# Patient Record
Sex: Male | Born: 1945
Health system: Southern US, Community
[De-identification: ages and names within clinical notes are randomized; demographics above are authoritative.]

## PROBLEM LIST (undated history)

## (undated) DIAGNOSIS — Z794 Long term (current) use of insulin: Secondary | ICD-10-CM

## (undated) DIAGNOSIS — N401 Enlarged prostate with lower urinary tract symptoms: Secondary | ICD-10-CM

## (undated) DIAGNOSIS — G250 Essential tremor: Secondary | ICD-10-CM

## (undated) DIAGNOSIS — T7840XA Allergy, unspecified, initial encounter: Secondary | ICD-10-CM

## (undated) DIAGNOSIS — Z87438 Personal history of other diseases of male genital organs: Secondary | ICD-10-CM

## (undated) DIAGNOSIS — N419 Inflammatory disease of prostate, unspecified: Secondary | ICD-10-CM

## (undated) DIAGNOSIS — M1990 Unspecified osteoarthritis, unspecified site: Secondary | ICD-10-CM

## (undated) DIAGNOSIS — E785 Hyperlipidemia, unspecified: Secondary | ICD-10-CM

## (undated) DIAGNOSIS — E669 Obesity, unspecified: Secondary | ICD-10-CM

## (undated) DIAGNOSIS — E079 Disorder of thyroid, unspecified: Secondary | ICD-10-CM

## (undated) DIAGNOSIS — N400 Enlarged prostate without lower urinary tract symptoms: Secondary | ICD-10-CM

## (undated) DIAGNOSIS — I10 Essential (primary) hypertension: Secondary | ICD-10-CM

## (undated) DIAGNOSIS — Z860100 Personal history of colon polyps, unspecified: Secondary | ICD-10-CM

## (undated) DIAGNOSIS — J189 Pneumonia, unspecified organism: Secondary | ICD-10-CM

## (undated) DIAGNOSIS — N529 Male erectile dysfunction, unspecified: Secondary | ICD-10-CM

## (undated) DIAGNOSIS — E039 Hypothyroidism, unspecified: Secondary | ICD-10-CM

## (undated) DIAGNOSIS — Z974 Presence of external hearing-aid: Secondary | ICD-10-CM

## (undated) DIAGNOSIS — N471 Phimosis: Secondary | ICD-10-CM

## (undated) DIAGNOSIS — Z87898 Personal history of other specified conditions: Secondary | ICD-10-CM

## (undated) DIAGNOSIS — N183 Chronic kidney disease, stage 3 unspecified: Secondary | ICD-10-CM

## (undated) DIAGNOSIS — E119 Type 2 diabetes mellitus without complications: Secondary | ICD-10-CM

## (undated) HISTORY — PX: TONSILLECTOMY: SUR1361

## (undated) HISTORY — PX: COLONOSCOPY: SHX174

## (undated) HISTORY — DX: Male erectile dysfunction, unspecified: N52.9

## (undated) HISTORY — DX: Allergy, unspecified, initial encounter: T78.40XA

## (undated) HISTORY — DX: Personal history of colonic polyps: Z86.010

## (undated) HISTORY — DX: Essential (primary) hypertension: I10

## (undated) HISTORY — DX: Benign prostatic hyperplasia without lower urinary tract symptoms: N40.0

## (undated) HISTORY — DX: Hyperlipidemia, unspecified: E78.5

## (undated) HISTORY — DX: Unspecified osteoarthritis, unspecified site: M19.90

## (undated) HISTORY — DX: Personal history of colon polyps, unspecified: Z86.0100

## (undated) HISTORY — DX: Obesity, unspecified: E66.9

## (undated) HISTORY — PX: CATARACT EXTRACTION W/ INTRAOCULAR LENS IMPLANT: SHX1309

---

## 1994-03-23 HISTORY — PX: HERNIA REPAIR: SHX51

## 2004-03-23 HISTORY — PX: COLONOSCOPY: SHX174

## 2004-03-23 LAB — HM COLONOSCOPY

## 2005-03-31 ENCOUNTER — Encounter: Admission: RE | Admit: 2005-03-31 | Discharge: 2005-03-31 | Payer: Self-pay | Admitting: Family Medicine

## 2005-10-22 ENCOUNTER — Ambulatory Visit: Payer: Self-pay | Admitting: Family Medicine

## 2005-10-23 ENCOUNTER — Ambulatory Visit: Payer: Self-pay | Admitting: Family Medicine

## 2005-11-12 ENCOUNTER — Ambulatory Visit: Payer: Self-pay | Admitting: Family Medicine

## 2006-01-18 ENCOUNTER — Ambulatory Visit: Payer: Self-pay | Admitting: Family Medicine

## 2006-01-21 HISTORY — PX: REVISION TOTAL HIP ARTHROPLASTY: SHX766

## 2006-01-22 ENCOUNTER — Encounter: Admission: RE | Admit: 2006-01-22 | Discharge: 2006-01-22 | Payer: Self-pay | Admitting: Family Medicine

## 2006-01-26 ENCOUNTER — Ambulatory Visit: Payer: Self-pay | Admitting: Family Medicine

## 2006-05-27 ENCOUNTER — Ambulatory Visit: Payer: Self-pay | Admitting: Family Medicine

## 2006-07-19 ENCOUNTER — Ambulatory Visit: Payer: Self-pay | Admitting: Family Medicine

## 2006-07-22 ENCOUNTER — Ambulatory Visit: Payer: Self-pay | Admitting: Family Medicine

## 2006-09-20 ENCOUNTER — Ambulatory Visit: Payer: Self-pay | Admitting: Family Medicine

## 2006-09-20 ENCOUNTER — Encounter: Admission: RE | Admit: 2006-09-20 | Discharge: 2006-09-20 | Payer: Self-pay | Admitting: Family Medicine

## 2006-09-27 ENCOUNTER — Ambulatory Visit: Payer: Self-pay | Admitting: Family Medicine

## 2006-12-08 ENCOUNTER — Ambulatory Visit: Payer: Self-pay | Admitting: Family Medicine

## 2007-01-13 ENCOUNTER — Ambulatory Visit: Payer: Self-pay | Admitting: Family Medicine

## 2007-01-19 ENCOUNTER — Ambulatory Visit: Payer: Self-pay | Admitting: Family Medicine

## 2007-02-08 ENCOUNTER — Inpatient Hospital Stay (HOSPITAL_COMMUNITY): Admission: RE | Admit: 2007-02-08 | Discharge: 2007-02-11 | Payer: Self-pay | Admitting: Orthopedic Surgery

## 2007-02-08 HISTORY — PX: TOTAL HIP ARTHROPLASTY: SHX124

## 2007-03-24 HISTORY — PX: BACK SURGERY: SHX140

## 2007-06-01 ENCOUNTER — Ambulatory Visit: Payer: Self-pay | Admitting: Family Medicine

## 2007-06-21 ENCOUNTER — Encounter: Admission: RE | Admit: 2007-06-21 | Discharge: 2007-06-21 | Payer: Self-pay | Admitting: Orthopedic Surgery

## 2007-07-07 ENCOUNTER — Encounter: Admission: RE | Admit: 2007-07-07 | Discharge: 2007-07-07 | Payer: Self-pay | Admitting: Orthopedic Surgery

## 2007-07-20 ENCOUNTER — Ambulatory Visit: Payer: Self-pay | Admitting: Family Medicine

## 2007-07-20 IMAGING — CR DG LUMBAR SPINE 2-3V
3 series · 3 of 3 positions shown · non-contrast
Comparison: CT myelogram [DATE].

CLINICAL DATA: Preop spinal stenosis.

LUMBAR SPINE - 2-3 VIEW

[t l-spine a.p. *]
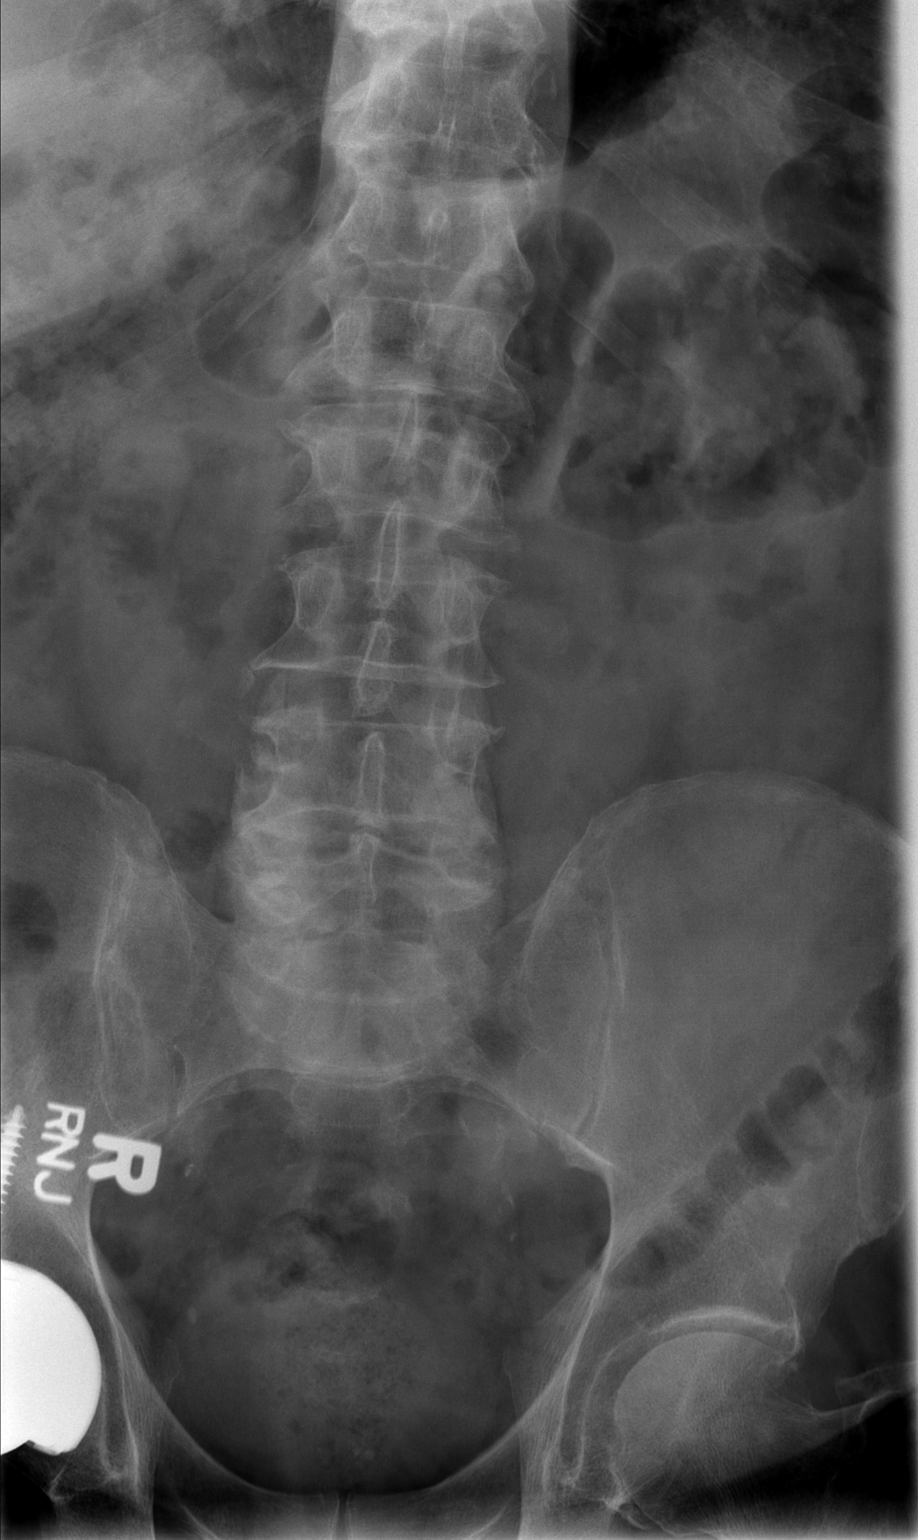

[t l-spine lat]
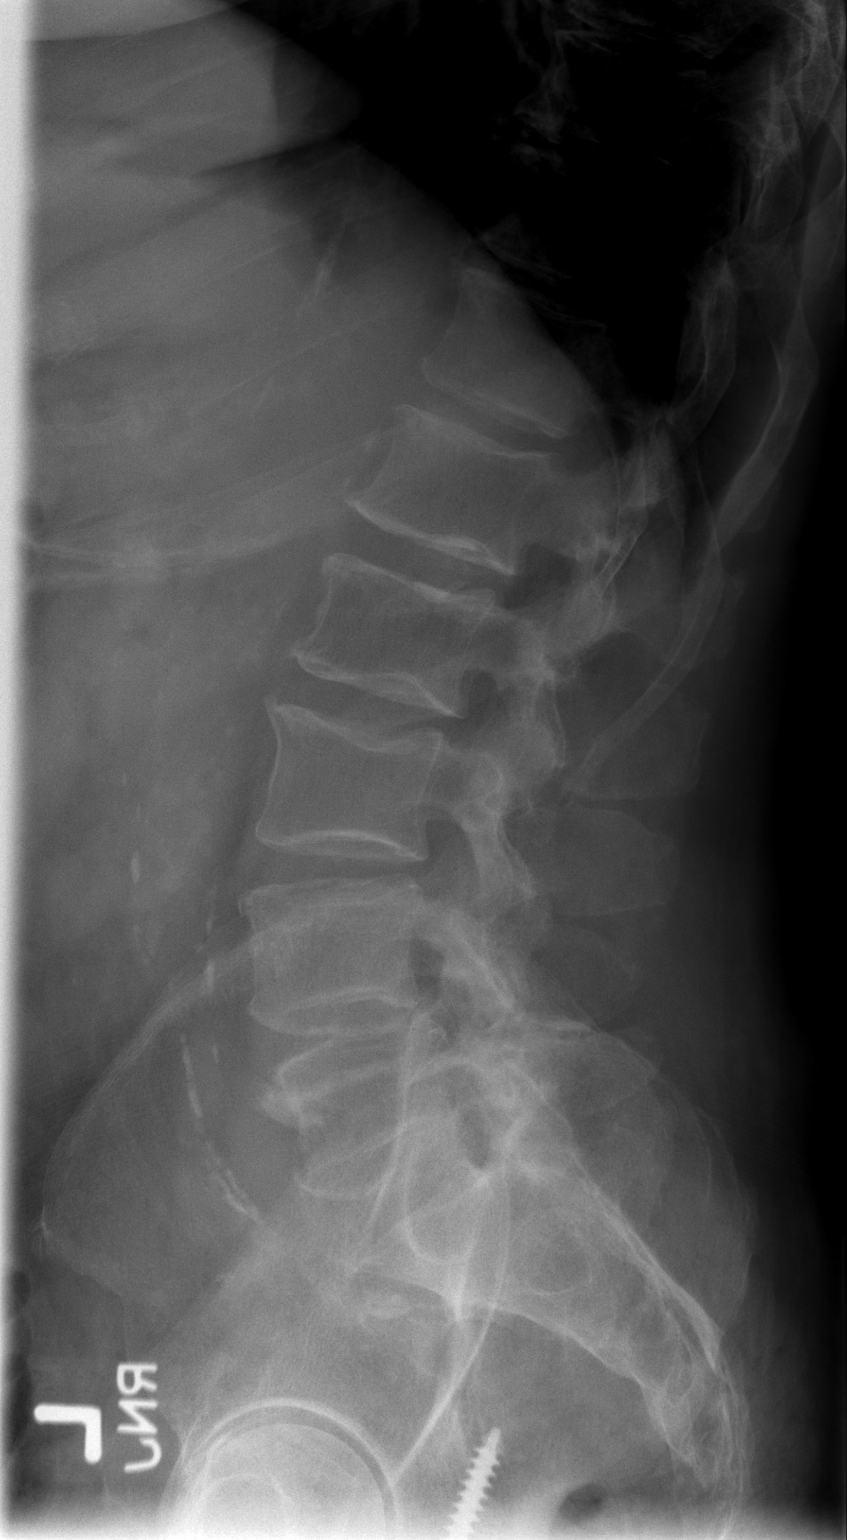

[t l-spine l5-s1 spot]
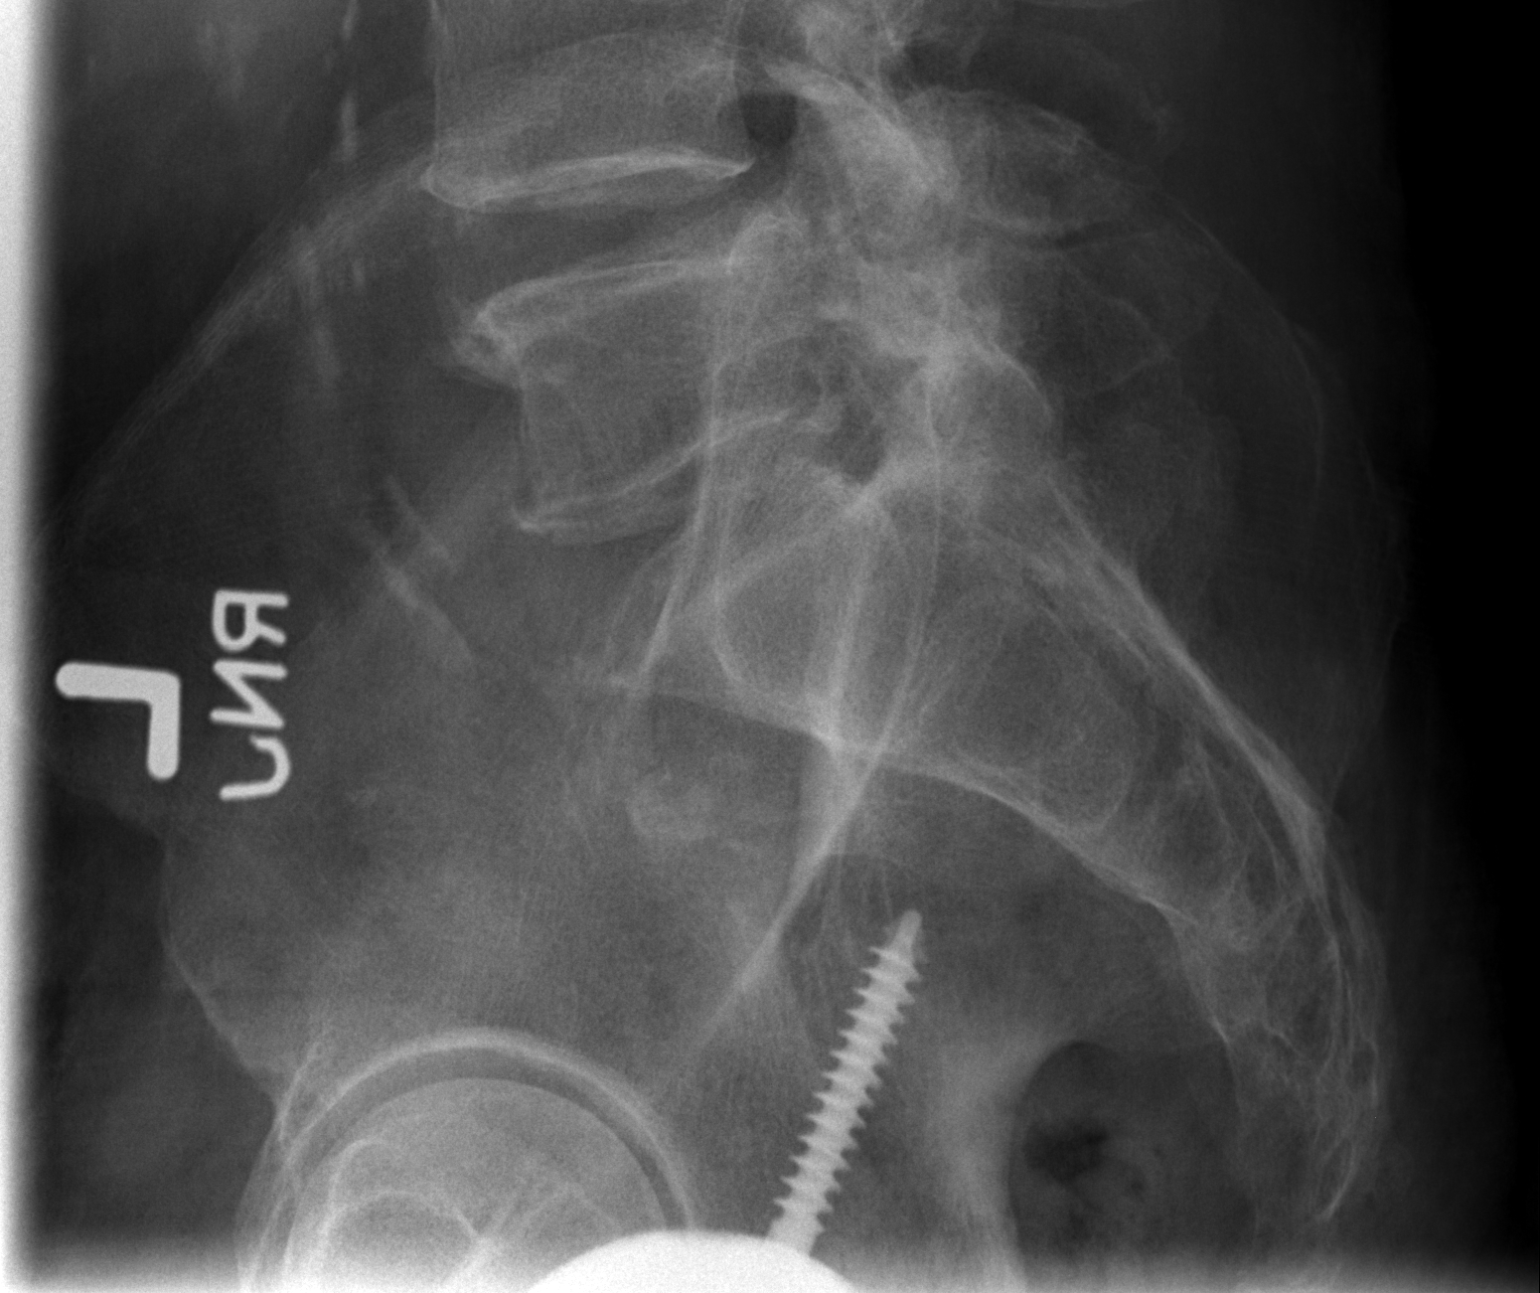

[3 of 3 positions shown; findings below may reference images not displayed]

FINDINGS: There are five lumbar type vertebral vertebrae.
Multilevel endplate degenerative changes are seen, worse L4-5 and
L5-S1.  There is facet hypertrophy as well.  There may be minimal
grade 1 anterolisthesis of L4 on L5.  Atherosclerotic calcification
of the arterial vasculature.
IMPRESSION: 1. Spondylosis, worst at L4-5 and L5-S1.
2.  Suspect minimal grade 1 anterolisthesis L4-5.

## 2007-07-27 ENCOUNTER — Inpatient Hospital Stay (HOSPITAL_COMMUNITY): Admission: RE | Admit: 2007-07-27 | Discharge: 2007-07-29 | Payer: Self-pay | Admitting: Orthopedic Surgery

## 2007-07-27 HISTORY — PX: LUMBAR LAMINECTOMY: SHX95

## 2007-11-01 ENCOUNTER — Ambulatory Visit: Payer: Self-pay | Admitting: Family Medicine

## 2007-12-15 ENCOUNTER — Ambulatory Visit: Payer: Self-pay | Admitting: Family Medicine

## 2008-03-21 ENCOUNTER — Ambulatory Visit: Payer: Self-pay | Admitting: Family Medicine

## 2008-07-11 ENCOUNTER — Ambulatory Visit: Payer: Self-pay | Admitting: Family Medicine

## 2008-08-22 ENCOUNTER — Ambulatory Visit: Payer: Self-pay | Admitting: Family Medicine

## 2008-11-29 ENCOUNTER — Ambulatory Visit: Payer: Self-pay | Admitting: Family Medicine

## 2008-12-27 ENCOUNTER — Ambulatory Visit: Payer: Self-pay | Admitting: Family Medicine

## 2008-12-27 ENCOUNTER — Encounter: Admission: RE | Admit: 2008-12-27 | Discharge: 2008-12-27 | Payer: Self-pay | Admitting: Family Medicine

## 2008-12-28 ENCOUNTER — Ambulatory Visit: Payer: Self-pay | Admitting: Family Medicine

## 2009-01-28 ENCOUNTER — Ambulatory Visit: Payer: Self-pay | Admitting: Family Medicine

## 2009-02-26 ENCOUNTER — Ambulatory Visit: Payer: Self-pay | Admitting: Family Medicine

## 2009-04-29 ENCOUNTER — Ambulatory Visit: Payer: Self-pay | Admitting: Family Medicine

## 2009-09-18 ENCOUNTER — Ambulatory Visit: Payer: Self-pay | Admitting: Family Medicine

## 2009-10-22 ENCOUNTER — Ambulatory Visit: Payer: Self-pay | Admitting: Family Medicine

## 2009-11-03 ENCOUNTER — Emergency Department (HOSPITAL_COMMUNITY): Admission: EM | Admit: 2009-11-03 | Discharge: 2009-11-04 | Payer: Self-pay | Admitting: Emergency Medicine

## 2009-11-07 ENCOUNTER — Ambulatory Visit: Payer: Self-pay | Admitting: Family Medicine

## 2009-11-19 ENCOUNTER — Ambulatory Visit: Payer: Self-pay | Admitting: Family Medicine

## 2010-02-19 ENCOUNTER — Encounter: Admission: RE | Admit: 2010-02-19 | Discharge: 2010-02-19 | Payer: Self-pay | Admitting: Family Medicine

## 2010-02-19 ENCOUNTER — Ambulatory Visit: Payer: Self-pay | Admitting: Family Medicine

## 2010-02-28 ENCOUNTER — Ambulatory Visit: Payer: Self-pay | Admitting: Family Medicine

## 2010-03-13 ENCOUNTER — Ambulatory Visit: Payer: Self-pay | Admitting: Family Medicine

## 2010-03-23 HISTORY — PX: CATARACT EXTRACTION W/ INTRAOCULAR LENS IMPLANT: SHX1309

## 2010-03-27 ENCOUNTER — Ambulatory Visit
Admission: RE | Admit: 2010-03-27 | Discharge: 2010-03-27 | Payer: Self-pay | Source: Home / Self Care | Attending: Family Medicine | Admitting: Family Medicine

## 2010-06-06 ENCOUNTER — Ambulatory Visit (INDEPENDENT_AMBULATORY_CARE_PROVIDER_SITE_OTHER): Payer: No Typology Code available for payment source | Admitting: Family Medicine

## 2010-06-06 ENCOUNTER — Ambulatory Visit: Payer: Self-pay | Admitting: Family Medicine

## 2010-06-06 DIAGNOSIS — IMO0001 Reserved for inherently not codable concepts without codable children: Secondary | ICD-10-CM

## 2010-06-06 LAB — URINALYSIS, ROUTINE W REFLEX MICROSCOPIC
Glucose, UA: 250 mg/dL — AB
Ketones, ur: 15 mg/dL — AB
pH: 5.5 (ref 5.0–8.0)

## 2010-06-06 LAB — URINE MICROSCOPIC-ADD ON

## 2010-06-06 LAB — URINE CULTURE: Culture  Setup Time: 201108151321

## 2010-06-17 ENCOUNTER — Ambulatory Visit (INDEPENDENT_AMBULATORY_CARE_PROVIDER_SITE_OTHER): Payer: No Typology Code available for payment source | Admitting: Family Medicine

## 2010-06-17 DIAGNOSIS — I10 Essential (primary) hypertension: Secondary | ICD-10-CM

## 2010-06-17 DIAGNOSIS — E669 Obesity, unspecified: Secondary | ICD-10-CM

## 2010-06-17 DIAGNOSIS — E785 Hyperlipidemia, unspecified: Secondary | ICD-10-CM

## 2010-06-17 DIAGNOSIS — E119 Type 2 diabetes mellitus without complications: Secondary | ICD-10-CM

## 2010-07-22 ENCOUNTER — Ambulatory Visit (INDEPENDENT_AMBULATORY_CARE_PROVIDER_SITE_OTHER): Payer: No Typology Code available for payment source | Admitting: Medical

## 2010-07-22 DIAGNOSIS — H6120 Impacted cerumen, unspecified ear: Secondary | ICD-10-CM

## 2010-07-31 ENCOUNTER — Other Ambulatory Visit: Payer: Self-pay | Admitting: Family Medicine

## 2010-08-02 ENCOUNTER — Other Ambulatory Visit: Payer: Self-pay | Admitting: Family Medicine

## 2010-08-04 ENCOUNTER — Other Ambulatory Visit: Payer: Self-pay | Admitting: Family Medicine

## 2010-08-05 NOTE — Op Note (Signed)
NAMEBARNELL, [name] NO.:  [id number]   MEDICAL RECORD NO.:  [id number]          PATIENT TYPE:  INP   LOCATION:  0005                         FACILITY:  [facility]   PHYSICIAN:  Madlyn Frankel. Charlann Boxer, M.D.  DATE OF BIRTH:  October 10, 1945   DATE OF PROCEDURE:  02/08/2007  DATE OF DISCHARGE:                               OPERATIVE REPORT   PREOPERATIVE DIAGNOSIS:  Right hip osteoarthritis.   POSTOPERATIVE DIAGNOSIS:  Right hip osteoarthritis.   PROCEDURE:  Right total hip replacement.   COMPONENTS USED:  DuPuy hip system/54 Pinnacle cup, single cancellous  screw placed, 36 metal on metal liner, and 9 high Trilock stem, 36 1.5  ball.   SURGEON:  Madlyn Frankel. Charlann Boxer, M.D.   ASSISTANT:  Yetta Glassman. Mann, PA   ANESTHESIA:  General.   ESTIMATED BLOOD LOSS:  300 mL.   COMPLICATIONS:  None.   DRAINS:  X1   INDICATIONS FOR PROCEDURE:  The patient is a pleasant 65 year old  gentleman kindly referred over for evaluation of his right hip pain.  Radiographs revealed end-stage changes.  He failed conservative measures  and wished to proceed with surgical intervention of DJD.  Risks of  infection, DVT, component failure, dislocation, need for revision  surgery all discussed.  Consent was obtained with the benefit of pain  relief as an option.   DESCRIPTION OF PROCEDURE:  The patient was brought to the operating  room.  Once adequate anesthesia, preoperative antibiotics, 2 grams of  Ancef were administered, the patient was positioned in the dorsal  lateral decubitus position with the right side up.  The right lower  extremity was prescrubbed, prepped and draped in usual sterile fashion.  A lateral based incision was made for the posterior portion of the hip.  The iliotibial band and gluteus fascia were incised posteriorly.  The  short external rotators were taken down separated from the posterior  capsule.  An L capsulotomy was created preserving the posterior leaflet  for  protection of the sciatic nerve and then at the end of the case,  anatomic repair.  The hip was dislocated and pathology noted.  Neck  osteotomy was made.  Please note on preoperative radiograms, the patient  was noted to have a short femoral neck with a varus angle, but had also  through the pelvis it appeared.   I made a neck osteotomy into the trochanteric fossa.  At this point, I  began broaching the femur.  Femoral retractors were placed.  Debridement  was carried out as necessary.  I used a starting drill followed by the  canal finder and then irrigated the canal without emboli.  I then began  broaching with a size 2 broach starting my anteversion of the femur at  25-30 degrees.  I broached all the way up to initially a size 7 and then  used a calcar planar to finish this cutoff.   At this point I attended to the acetabulum where acetabular retractors  were placed.  A labrectomy was carried out and he was noted to have some  osteophytes anterior and posterior.  I  kept these in place initially.  Following retractor placement, I began reaming with the 45 reamer and  went to even numbers and sharper reamer.  We reamed all the way up to a  52 reamer and then a 53.  I used curved reamers to help with my  orientation to the cup.  Following the use of the 53 reamer, the bone  bed preparation appeared to be very good.  I did ream down to the medial  wall and then probably reamed a little proximal for bony coverage.   The final 54 Pinnacle cup was then impacted with an excellent scratch  fit.  The position of the cup was about 45 degrees adduction which with  his larger thorax probably needed to come down to 35-40 degree angle.  This was forward flexed about 20 degrees with the use of my guide.  At  this point a cancellous screw was placed and 36 liner placed.  At this  point trial reduction was carried out with 7 stem.  Initial reduction  indicated that the leg lengths were shorter and  there was significant  shuck available.  I took the 7 broach out and went to an 8 which sat  just basically at the same level and so I went to a 9 which sat a few mm  out of my neck cut.  I then trialed with a 9 high offset neck and 36 1.5  ball.  Hip stability was very good noting some anterior impingement with  internal rotation about 70 degrees with neutral abduction, stable in  sleep position, no evidence of impingement with external rotation and  extension.  When compared to the down leg, the leg lengths appeared to  be equal or within acceptable limits.   At this point, I chose the size 9 high offset stem, the trial components  were removed.  Central hole eliminator placed and a 36 metal liner  impacted.   At this point with the acetabulum exposed, I used a half inch curved  osteotome and removed posterior osteophytes through the inferior aspect  of the cup which was the calcified inferior acetabular ligament.  I also  removed osteophyte anteriorly from approximately 1 o'clock to 5 o'clock  on the face of a clock on his right hip.  At this point the final 9 high  offset stem was impacted to the level of my broach and I did retrial  just to make certain I was happy with position and orientation.  A final  36 1.5 ball was then impacted onto the clean and dried trunion.  Hip  anteversion was 45-50 degrees.  Hip was irrigated throughout the case  and again at this point.  I reapproximated the posterior capsule of the  superior leaflet using #1 Ethibond.  A medium Hemovac drain was placed  deep.  The iliotibial band was reapproximated with #1 Vicryl.  The  gluteal fascia was reapproximated using #1 Vicryl.  The remainder of the  wound was closed in layers.  The remainder of the wound was at this  point closed with 2-0 Vicryl and running 4-0 Monocryl.  The hip wound  was then cleaned, dried, and dressed sterilely with Steri-Strips and  Mepilex dressing.  He was brought to the recovery  room, extubated, in  stable condition.      Madlyn Frankel Charlann Boxer, M.D.  Electronically Signed     MDO/MEDQ  D:  02/08/2007  T:  02/08/2007  Job:  [id number]  cc:   [name], M.D.  Fax: [phone]

## 2010-08-05 NOTE — H&P (Signed)
[name], [name] NO.:  [id number]   MEDICAL RECORD NO.:  [id number]        PATIENT TYPE:  LINP   LOCATION:                               FACILITY:  [facility]   PHYSICIAN:  Madlyn Frankel. Charlann Boxer, M.D.  DATE OF BIRTH:  12-25-1945   DATE OF ADMISSION:  02/08/2007  DATE OF DISCHARGE:                              HISTORY & PHYSICAL   PROCEDURE:  Right total hip arthroplasty.   CHIEF COMPLAINTS:  Right hip and groin pain.   HISTORY OF PRESENT ILLNESS:  A 65 year old male with a history of right  hip pain secondary to osteoarthritis.  Refractory to all conservative  treatment including intra-articular injection.  He has been  presurgically assessed by Dr. [name].   PAST MEDICAL HISTORY:  Significant for  1. Osteoarthritis.  2. Hypertension.  3. Benign prostatic hypertrophy.  4. Diabetes.  5. Hypercholesteremia.   PAST SURGICAL HISTORY:  1. Tonsillectomy.  2. Hernia repair.   FAMILY HISTORY:  Heart disease, hypertension, diabetes, stroke,  arthritis.   SOCIAL HISTORY:  Married.  Primary caregiver will be wife after surgery.   DRUG ALLERGIES:  SULFA, and during previous anesthesia he broke out in a  sweat and had some nausea and dry heaves.   MEDICATIONS:  1. Avandamet two times daily 4 mg/1000 mg combo.  2. Simvastatin 40 mg p.o. daily.  3. Benicar 40 mg p.o. daily.  4. Doxazosin 2 mg p.o. daily.  5. Lisinopril 12.5 mg one p.o. daily.  6. Ibuprofen p.r.n.  7. Multivitamin p.o. daily.  8. Aspirin 325 mg p.o. daily.  9. Glucosamine chondroitin 1000 mg two times daily.  10.Cortef 4 mg p.o. daily.   REVIEW OF SYSTEMS:  GENITOURINARY:  Does have instances of blood in  urine with weak streaming urgency.  HEMATOLOGY:  Easily bruises.  Otherwise see HPI.   PHYSICAL EXAMINATION:  VITAL SIGNS:  Pulse 72, respirations 18, blood  pressure 134/66.  GENERAL:  Awake, alert and oriented, well-developed, well-nourished, no  acute distress.  NECK:  Supple.  No  carotid bruits.  CHEST/LUNGS:  Clear to auscultation bilaterally.  BREASTS:  Deferred.  HEART:  Regular rate and rhythm without gallops, clicks, rubs or  murmurs.  ABDOMEN:  Soft, nontender, nondistended.  Bowel sounds present.  GENITOURINARY:  Deferred.  EXTREMITIES:  Decreased range of motion, increased pain with range of  motion.  Does walk with significant limp.  SKIN:  No cellulitis.  NEUROLOGIC:  Intact distal sensibilities.   Labs, EKG, chest x-ray pending.   IMPRESSION:  1. Osteoarthritis.  2. Diabetes.  3. Hypertension.  4. Hyperlipidemia.  5. Benign prostatic hypertrophy.   PLAN OF ACTION:  Right total hip arthroplasty.  Risks and complications  were discussed.   Postoperative medications including Lovenox, Robaxin, iron, aspirin,  Colace, MiraLax provided at time of history and physical.  Postoperative  pain medicine will be provided at time of surgery.     ______________________________  Yetta Glassman [address], [place]      Madlyn Frankel. Charlann Boxer, M.D.  Electronically Signed    BLM/MEDQ  D:  01/27/2007  T:  01/28/2007  Job:  [id number]   cc:   [name], M.D.  Fax: [phone]

## 2010-08-05 NOTE — H&P (Signed)
NAME:  [name], [name]              ACCOUNT NO.:  [id number]   MEDICAL RECORD NO.:  [id number]          PATIENT TYPE:  INP   LOCATION:  NA                           FACILITY:  [facility]   PHYSICIAN:  Georges Lynch. [name], M.D.DATE OF BIRTH:  Jul 03, 1945   DATE OF ADMISSION:  07/27/2007  DATE OF DISCHARGE:                              HISTORY & PHYSICAL   CHIEF COMPLAINT:  Lower back and right leg pain.   HISTORY OF PRESENT ILLNESS:  The patient is a 65 year old gentleman with  some lower back and right leg issues.  Evaluation by Dr. [name] found  that he has significant spinal stenosis at L4-5 with about a 1 mm  slippage on flexion/extension myelogram.  He also has a small disk at L4-  5 on the right.  The patient has classic L5 right nerve root irritation.  The patient and Dr. [name] have elected to proceed with a central  decompression lumbar laminectomy and foraminotomy bilaterally at L4-5  with an evaluation of  the desk.   PAST MEDICAL HISTORY:  1. Hypertension.  2. Hypercholesterolemia.  3. Diabetes.  4. BPH.  5. Hearing impaired with bilateral hearing aids.  6. Upper and lower dentures.   ALLERGIES:  SULFA   CURRENT MEDICATIONS:  1. Benicar 40 mg a day.  2. Avandamet 06/998 mg twice a day.  3. Lisinopril/hydrochlorothiazide 20/12.5 mg once a day.  4. Doxazosin 2 mg once a day.  5. Methocarbamol 500 mg as needed.  6. Simvastatin 40 mg once a day.  7. Dilaudid 2 mg 1 tablet every 4-6 hours as needed.   PRIMARY CARE PHYSICIAN:  [name], M.D.   PAST SURGICAL HISTORY:  1. Tonsillectomy at 65 years of age.  2. Abdominal hernia in 1996.  3. Right total hip arthroplasty in 2008.  4. The patient did have some problems with conscious sedation with his      intra-articular hip injections in 2008.   FAMILY MEDICAL HISTORY:  Father is deceased from stroke at 53 years of  age.  Mother is deceased from diabetes, kidney failure at age 28.   SOCIAL HISTORY:  The patient is  married, lives with his wife.  He  stopped smoking about 13 years previous.  No use of alcohol or drugs.  He has one grown child.   PHYSICAL EXAMINATION:  VITALS:  Height is 5 feet 8, weight is 230, blood  pressure is 128/68, pulse of 74 and regular, respirations are 12 and  nonlabored, patient is afebrile.  GENERAL:  This is short-statured, centrally obese gentleman, conscious,  alert and appropriate, appears to be a good historian.  HEENT: Head was normocephalic.  Pupils equal, round and reactive.  Gross  hearing was intact with bilateral hearing aids in place.  NECK:  Supple.  No palpable lymphadenopathy.  Good range of motion.  CHEST:  Lung sounds were clear and equal bilaterally.  No wheezes,  rales, rhonchi.  HEART:  Regular rate and rhythm.  No murmurs, rubs or gallops.  ABDOMEN:  Round, soft nontender.  Bowel sounds present.  EXTREMITIES:  Upper extremities had good range  of motion.  Lower  Extremities: He had some soreness with his right hip which has a well-  healed lateral incision.  He had full extension.  He was able to flex it  up to 110 degrees.  He had about 15 degrees internal-external rotation.  He had full range of motion of his left hip.  He had good range of both  knees and ankles today.  NEURO:  The patient was conscious, alert and appropriate.  Good  historian.  He had no gross neurologic defects noticed at this time.  He  had good motor strength in the lower extremities without any foot drop.  He had good light touch sensation.  PERIPHERAL VASCULAR:  Carotid pulses were no bruits.  Radial pulses were  2+.  Posterior tibial pulses were 1+.  BREAST, RECTAL AND GU:  Exams were deferred.   IMPRESSION:  1. Severe spinal stenosis at L4-5 with a 1 mm slip with      flexion/extension with an L5 nerve root irritation.  2. Hypertension.  3. Hypercholesterolemia.  4. Benign prostatic hypertrophy.  5. Diabetes.  6. Hearing impaired with bilateral hearing aids.  7.  Upper and lower dentures.   PLAN:  The patient will undergo all routine labs and tests prior to  having a central decompression lumbar laminectomy at L4-5 with bilateral  foraminotomies with evaluation of the disk at L4-5.      [name], P.A.    ______________________________  Georges Lynch [name], M.D.    RWK/MEDQ  D:  07/21/2007  T:  07/21/2007  Job:  [id number]

## 2010-08-05 NOTE — Op Note (Signed)
NAME:  [name], [name] NO.:  [id number]   MEDICAL RECORD NO.:  [id number]          PATIENT TYPE:  INP   LOCATION:  0004                         FACILITY:  [facility]   PHYSICIAN:  Georges Lynch. [name], M.D.DATE OF BIRTH:  1945/05/07   DATE OF PROCEDURE:  07/27/2007  DATE OF DISCHARGE:                               OPERATIVE REPORT   SURGEON:  Dr. [name].   ASSISTANT:  Dr. Jene Every.   PREOPERATIVE DIAGNOSES:  1. Severe spinal stenosis at L4-5.  2. Moderate spinal stenosis at 3-4.  3. 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   POSTOPERATIVE DIAGNOSES:  1. Severe spinal stenosis at L4-5.  2. Moderate spinal stenosis at 3-4.  3. 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   Note, all his pain was on the right lower extremity.   OPERATION:  1. Central decompressive lumbar laminectomy at L4-5.  2. Decompressive lumbar laminectomy at L3-4.  3. Foraminotomies at both levels bilaterally.   DESCRIPTION OF PROCEDURE:  Under general anesthesia with the patient on  a spinal frame, a routine orthopedic prep and draping of the back was  carried out.  Great care was taken with his positioning because of his  total hip on the right.  Following that, two needles were placed in the  back for localization purposes and an x-ray was taken.  We verified the  L4-5, 3-4 space.  An incision was made.  Bleeders identified and  cauterized.  The muscle was stripped from the spinous processes of the  lamina of L3-4 and L4-5.  We then went down and inserted the self-  retaining retractors.  Another x-ray was taken.  Finally, a third x-ray  was taken to verified our 4-5 space.  We then removed the spinous  process of L4 and did a complete laminectomy.  We then went up  proximally, removed part of the spinous process of L3 and went up into  the L3-4 region as well and decompressed that by removing ligamentum  flavum.  So basically, we brought the microscope in and then protected  the dura and  dissected the adhesions off of the dura, mainly involving  the ligamentum flavum.  We protected the dura, went out laterally  bilaterally and did foraminotomies as well at 4-5 and 3-4.  He had some  adhesions down from what looked like a ruptured synovial cyst at 4-5.  We removed those as well.  When the dissection was completed, we were  able to easily pass a hockey-stick proximally and distally, there was no  resistance.  We went out into the foramina bilaterally and they were  wide open as well.  We thoroughly irrigated out the area.  There were no  herniated disks.  We then injected some FloSeal 10 mL.  I then loosely  applied some thrombin-soaked Gelfoam in and about the  laminar regions and just above the dura.  We then closed the wound in  layers in the usual fashion.  I left a small portion of the proximal and  distal deep parts of the wound open for drainage purposes.  The subcu  was closed with #1 Vicryl, the skin with metal staples and sterile  Neosporin dressings were applied.           ______________________________  Georges Lynch [name], M.D.     RAG/MEDQ  D:  07/27/2007  T:  07/27/2007  Job:  [id number]   cc:   [name], M.D.  Fax: [phone]

## 2010-08-06 ENCOUNTER — Telehealth: Payer: Self-pay | Admitting: Family Medicine

## 2010-08-07 NOTE — Telephone Encounter (Signed)
Faxed order

## 2010-08-08 NOTE — Discharge Summary (Signed)
NAMEWRIGHT, GRAVELY NO.:  [id number]   MEDICAL RECORD NO.:  [id number]          PATIENT TYPE:  INP   LOCATION:  1619                         FACILITY:  [facility]   PHYSICIAN:  Georges Lynch. [name], M.D.DATE OF BIRTH:  20-Oct-1945   DATE OF ADMISSION:  07/27/2007  DATE OF DISCHARGE:  07/29/2007                               DISCHARGE SUMMARY   ADMISSION DIAGNOSES:  1. Severe spinal stenosis L4-5 with a 1-mm slipped with      flexion/extension with a L5 nerve root irritation.  2. Hypertension.  3. Hypercholesterolemia.  4. Benign prostatic hypertrophy.  5. Diabetes.  6. Hearing impaired with bilateral hearing aids.  7. Upper and lower dentures.   DISCHARGE DIAGNOSES:  1. Decompressive lumbar laminectomy at L3-4, L4-5 with foraminotomies      bilaterally.  2. Hypertension.  3. Hypercholesterolemia.  4. Benign prostatic hypertrophy.  5. Diabetes.  6. Hearing impaired with bilateral hearing aids.  7. Upper and lower dentures.   HISTORY OF PRESENT ILLNESS:  The patient is a 65 year old gentleman with  lower back pain and right leg pain, evaluated and found by CT myelogram  to have significant spinal stenosis at L4-5 with a 1-mm slippage.  The  patient also had a small disk herniation at L4-5 on the right.  The  patient has elected proceed with surgical decompression by Dr. [name].   ALLERGIES:  SULFA.   CURRENT MEDICATIONS ON ADMISSION:  1. Benicar 40 mg a day.  2. Avandamet 06/998 mg twice a day.  3. Lisinopril/hydrochlorothiazide 20/12.5 mg a day.  4. Doxazosin 2 mg a day.  5. Robaxin 500 mg as needed.  6. Simvastatin 40 mg once a day.  7. Dilaudid 2 mg tablets 1 tablet every 4-6 hours for pain if needed.   SURGICAL PROCEDURE:  On Jul 27, 2007, the patient was taken to the OR by  Dr. Worthy Rancher, assisted by Dr. Jene Every, and under general  anesthesia the patient underwent a decompressive lumbar laminectomy at  L3-4 and L4-5 with bilateral  foraminotomies.  The patient tolerated the  procedure well with no complications.  The patient was transferred to  the recovery room and then to orthopedic floor in good condition.   CONSULTS:  The following routine consults were requested; physical  therapy and case management.   HOSPITAL COURSE:  On Jul 27, 2007, the patient was admitted to [facility] under the care of Dr. Worthy Rancher.  The patient was taken  to the OR, where a central decompressive lumbar laminectomy was  performed at L3-4 and L4-5 with bilateral foraminotomies.  The patient  tolerated the procedure well.  The patient was transferred to the  recovery room and then to the orthopedic floor in good condition with IV  antibiotics and pain medicines.  The patient then incurred 2 days  postoperative care on the orthopedic floor in which the patient was able  to wean off his IV narcotics and antibiotics without any problems.  The  patient's wound remained benign for any signs of infection.  The patient  was able to void.  His vital signs were stable throughout.  He was able  to ambulate with the use of Aoun with physical therapy.  The pain in  his lower extremity significantly improved.  He was neurologically  intact.  It was determined on postop day #2, that he was orthopedically  and medically stable and ready for discharge home with outpatient  physical therapy arrangements made.   LABORATORY DATA:  CBC on admission found WBCs 9.0, hemoglobin 11.9,  hematocrit 34.7, platelets 287.  Routine chemistries found sodium 138,  potassium 4.5, glucose 155, BUN 15, creatinine 1.09 with an estimated  GFR of greater than 60.  Urinalysis was normal on admission.   DISCHARGE INSTRUCTIONS:   DIET:  The patient is to maintain an 1800 calorie ADA diet.   ACTIVITY:  No restrictions.   WOUND CARE:  The patient is to change his dressing daily.   FOLLOW UP:  The patient is to follow-up with Dr. [name] in his office 2   weeks from date of surgery.  The patient is to call [phone].   DISCHARGE MEDICATIONS:  1. Percocet 10/650 one tablet every 4-6 hours for pain if needed.  2. Robaxin 500 mg once every 6 hours for muscle spasms if needed.  3. Lisinopril/hydrochlorothiazide 20/12.5 mg a day.  4. Doxazosin 2 mg a day.  5. Simvastatin 40 mg a day.  6. Benicar 40 mg a day.  7. Avandamet 06/998 mg 1 tablet twice a day.  8. Tylenol 500 mg as needed.  9. Aspirin 325 mg once a day.  10.Multivitamin 1 tablet once a day.   The patient's condition upon discharge to home is listed as improved and  good.     [name], P.A.    ______________________________  Georges Lynch [name], M.D.   RWK/MEDQ  D:  08/08/2007  T:  08/08/2007  Job:  [id number]

## 2010-08-08 NOTE — Discharge Summary (Signed)
[name], [name] NO.:  [id number]   MEDICAL RECORD NO.:  [id number]          PATIENT TYPE:  INP   LOCATION:  1606                         FACILITY:  [facility]   PHYSICIAN:  Madlyn Frankel. Charlann Boxer, M.D.  DATE OF BIRTH:  Apr 20, 1945   DATE OF ADMISSION:  02/08/2007  DATE OF DISCHARGE:  02/11/2007                               DISCHARGE SUMMARY   ADMISSION DIAGNOSES:  1. Osteoarthritis.  2. Hypertension.  3. Benign prostatic hypertrophy.  4. Diabetes.  5. Hypercholesteremia.   DISCHARGE DIAGNOSES:  1. Osteoarthritis.  2. Hypertension.  3. Benign prostatic hypertrophy.  4. Diabetes.  5. Hypercholesteremia.   BRIEF HISTORY OF PRESENT ILLNESS:  A 65 year old male with a history of  right hip pain secondary to osteoarthritis.  He has been refractory to  all conservative treatment, including intra-articular injection.  Presurgically assessed by Dr. [name].   PROCEDURE:  Right total hip replacement.  Components were a metal-on-  metal.  Surgeon Dr. Durene Romans.  Assistant Dwyane Luo.   CONSULTATIONS:  None.   LABORATORIES:  CBC postop day #1 hematocrit 29.8.  Routine chemistry all  within normal limits.  Postoperative day #2 checked again.  His  hematocrit was stable at 29.4.  Chemistries:  Sodium 135, potassium 3.7,  creatinine 0.94 and his glucose 134 and stable.   RADIOLOGY:  Chest 2-view:  Some mild cardiomegaly.  Portable pelvis:  Satisfactory appearance of a right total hip arthroplasty.   CARDIOLOGY:  EKG:  Nonspecific ST/T wave changes.   HOSPITAL COURSE:  The patient underwent right total hip replacement,  tolerated the procedure well, was admitted to the orthopedic floor.  Only complaint on postop day #1 was a sore throat.  He was afebrile,  which he  remained afebrile throughout.  His Hemovac was pulled.  His  dressing was clean, dry and intact.  It was also changed after postop  day #1 on a daily basis with no significant drainage from his wound.   He  was  neurovascularly intact to his right lower extremity throughout his  course of stay.  He was PT, OT weightbearing as tolerated throughout.  He was provided some Chloraseptic spray, which helped with his throat,  and he progressed nicely with physical therapy and was able to ambulate  at least 50 feet prior to discharge.  On postop day #2 doing well,  afebrile, continued DVT prophylaxis, which included Lovenox.  Postop day  #3 was having some difficulty getting out of bed with otherwise no  complaints.  His dressing was changed.  The wound had  no drainage.  He  was neurovascularly intact and ready for home discharge, weightbearing  as tolerated.   DISCHARGE DISPOSITION:  Discharged home.  Plan home health care, PT and  Lovenox teaching,  weightbearing as tolerated.   DISCHARGE DIET:  Regular as tolerated by the patient.   DISCHARGE WOUND CARE:  Keep dry.   DISCHARGE PHYSICAL THERAPY:  He is weightbearing as tolerated with the  use of a rolling Sassi.   DISCHARGE MEDICATIONS:  1. Vicodin 5/325 one to two p.o. q.4-6h. p.r.n. pain.  2. Lovenox 40 mg subcu q. 24h. x11 days.  3. Robaxin 500 mg p.o. q.6h. muscle spasm pain.  4. Benicar 40 mg p.o. daily.  5. Lisinopril/HCTZ 20/12.5 one p.o. daily.  6. Simvastatin 40 mg 1 p.o. daily.  7. Doxazosin 2 mg 1 p.o. daily.  8. Avandamet 06/998 two 1 time daily.  9. __________ p.r.n.  10.Multivitamin p.o. daily.   DISCHARGE SPECIAL INSTRUCTIONS:  1. Follow up with Dr. Charlann Boxer at phone number [phone] in 10 to 14 days      for a wound check.  2. He is weightbearing as tolerated with the use of a rolling Mccall.  3. TED hose on 12 hours, off 12 hours.     ______________________________  Yetta Glassman [address], [place]      Madlyn Frankel. Charlann Boxer, M.D.  Electronically Signed    BLM/MEDQ  D:  03/07/2007  T:  03/07/2007  Job:  [id number]   cc:   [name], M.D.  Fax: [phone]

## 2010-09-03 ENCOUNTER — Encounter: Payer: Self-pay | Admitting: Family Medicine

## 2010-09-05 ENCOUNTER — Telehealth: Payer: Self-pay | Admitting: Family Medicine

## 2010-09-05 ENCOUNTER — Ambulatory Visit (INDEPENDENT_AMBULATORY_CARE_PROVIDER_SITE_OTHER): Payer: No Typology Code available for payment source | Admitting: Family Medicine

## 2010-09-05 ENCOUNTER — Encounter: Payer: Self-pay | Admitting: Family Medicine

## 2010-09-05 DIAGNOSIS — E119 Type 2 diabetes mellitus without complications: Secondary | ICD-10-CM | POA: Insufficient documentation

## 2010-09-05 DIAGNOSIS — I10 Essential (primary) hypertension: Secondary | ICD-10-CM

## 2010-09-05 DIAGNOSIS — E1159 Type 2 diabetes mellitus with other circulatory complications: Secondary | ICD-10-CM

## 2010-09-05 DIAGNOSIS — E669 Obesity, unspecified: Secondary | ICD-10-CM

## 2010-09-05 DIAGNOSIS — E785 Hyperlipidemia, unspecified: Secondary | ICD-10-CM

## 2010-09-05 DIAGNOSIS — E1169 Type 2 diabetes mellitus with other specified complication: Secondary | ICD-10-CM

## 2010-09-05 LAB — POCT GLYCOSYLATED HEMOGLOBIN (HGB A1C): Hemoglobin A1C: 7.4

## 2010-09-05 MED ORDER — GLIPIZIDE 5 MG PO TABS: 5.0000 mg | ORAL_TABLET | Freq: Two times a day (BID) | ORAL | Status: DC

## 2010-09-05 MED ORDER — LISINOPRIL-HYDROCHLOROTHIAZIDE 20-12.5 MG PO TABS: 1.0000 | ORAL_TABLET | Freq: Every day | ORAL | Status: DC

## 2010-09-05 MED ORDER — METFORMIN HCL 1000 MG PO TABS: 1000.0000 mg | ORAL_TABLET | Freq: Two times a day (BID) | ORAL | Status: DC

## 2010-09-05 NOTE — Progress Notes (Signed)
  Subjective:    Patient ID: [name], male    DOB: [birth year], 65 y.o.   MRN: [id number]  HPI He is here for a diabetes recheck. Blood pressure stable. He did by a bicycle and started to become more physically active. He has been checking the sugars in the morning and they run in the 180 range. He then changed when he checks his blood sugar and found it lower after he exercised and before lunch. He continues on medications listed in the chart. Quit smoking several years ago. He was seen by ophthalmology in April. He does check his feet periodically.   Review of Systems     Objective:   Physical Exam alert and in no distress. Hemoglobin A1c 7.4.        Assessment & Plan:  Diabetes. Hypertension. Hyperlipidemia. Obesity. I again had a discussion with him concerning making diet and exercise changes. Recheck here in 4 months. Several of his medications were renewed.

## 2010-09-05 NOTE — Patient Instructions (Signed)
Working on making those diet and exercise changes

## 2010-09-05 NOTE — Telephone Encounter (Signed)
Pharmacy informed-lm 6/15

## 2010-10-14 ENCOUNTER — Other Ambulatory Visit: Payer: Self-pay | Admitting: Family Medicine

## 2010-10-23 ENCOUNTER — Other Ambulatory Visit: Payer: Self-pay | Admitting: Medical

## 2010-10-23 MED ORDER — GLIPIZIDE 5 MG PO TABS: 5.0000 mg | ORAL_TABLET | Freq: Two times a day (BID) | ORAL | Status: DC

## 2010-12-02 ENCOUNTER — Encounter: Payer: Self-pay | Admitting: Medical

## 2010-12-02 ENCOUNTER — Ambulatory Visit (INDEPENDENT_AMBULATORY_CARE_PROVIDER_SITE_OTHER): Payer: No Typology Code available for payment source | Admitting: Medical

## 2010-12-02 VITALS — BP 140/78 | HR 80 | Temp 98.0°F | Resp 20 | Ht 68.0 in | Wt 240.0 lb

## 2010-12-02 DIAGNOSIS — Z9889 Other specified postprocedural states: Secondary | ICD-10-CM

## 2010-12-02 DIAGNOSIS — Z974 Presence of external hearing-aid: Secondary | ICD-10-CM

## 2010-12-02 DIAGNOSIS — H6120 Impacted cerumen, unspecified ear: Secondary | ICD-10-CM

## 2010-12-02 NOTE — Progress Notes (Signed)
Subjective:   HPI Here for complaint of earwax buildup in both ears.  He was seen here for similar in May.  No other complaint.  Review of Systems Constitutional: denies fever, chills, sweats ENT: no runny nose, ear pain, sore throat, hoarseness, sinus pain, teeth pain Gastroenterology: denies nausea, vomiting     Objective:   Physical Exam  General appearance: alert, no distress, WD/WN HEENT: bilat ear canal with impacted cerumen; otherwise normocephalic, +bilat hearing aids present    Assessment & Plan:    Encounter Diagnosis  Name Primary?  . Impacted cerumen Yes     Discussed risk/benefits of procedure and patient agrees to procedure. Successfully used warm water lavage to remove impacted cerumen from both ear canals. Patient tolerated procedure well. Gave handout is below. Call or return if worse.  Referral to ENT at his request for recurrent thick wax, clogging of his hearing aids.

## 2010-12-16 LAB — CBC
Platelets: 287
RDW: 13.8
WBC: 9

## 2010-12-16 LAB — COMPREHENSIVE METABOLIC PANEL
ALT: 19
AST: 19
Albumin: 3.6
Alkaline Phosphatase: 45
Chloride: 104
Potassium: 4.5
Sodium: 138
Total Protein: 6.7

## 2010-12-16 LAB — DIFFERENTIAL
Basophils Relative: 0
Eosinophils Absolute: 0.1
Eosinophils Relative: 2
Monocytes Absolute: 0.7
Monocytes Relative: 8

## 2010-12-16 LAB — URINALYSIS, ROUTINE W REFLEX MICROSCOPIC
Glucose, UA: NEGATIVE
Ketones, ur: NEGATIVE
Protein, ur: NEGATIVE

## 2010-12-16 LAB — APTT: aPTT: 32

## 2010-12-22 ENCOUNTER — Other Ambulatory Visit: Payer: Self-pay | Admitting: Family Medicine

## 2010-12-30 LAB — CBC
HCT: 29.4 — ABNORMAL LOW
Hemoglobin: 10.5 — ABNORMAL LOW
MCV: 91.9
RBC: 3.2 — ABNORMAL LOW
RBC: 3.21 — ABNORMAL LOW
WBC: 11.4 — ABNORMAL HIGH
WBC: 13.6 — ABNORMAL HIGH

## 2010-12-30 LAB — PROTIME-INR
INR: 1
INR: 1.1
Prothrombin Time: 13.5
Prothrombin Time: 13.6

## 2010-12-30 LAB — URINALYSIS, ROUTINE W REFLEX MICROSCOPIC
Ketones, ur: NEGATIVE
Nitrite: NEGATIVE
Specific Gravity, Urine: 1.023
Urobilinogen, UA: 1
pH: 6.5

## 2010-12-30 LAB — BASIC METABOLIC PANEL
BUN: 13
CO2: 28
Calcium: 8.2 — ABNORMAL LOW
Calcium: 9.4
Chloride: 101
GFR calc Af Amer: >60
GFR calc Af Amer: >60
GFR calc non Af Amer: >60
GFR calc non Af Amer: >60
Potassium: 3.7
Potassium: 3.8
Sodium: 133 — ABNORMAL LOW
Sodium: 135

## 2010-12-30 LAB — HEMOGLOBIN A1C: Hgb A1c MFr Bld: 6.5 — ABNORMAL HIGH

## 2010-12-30 LAB — HEMOGLOBIN AND HEMATOCRIT, BLOOD: Hemoglobin: 11.7 — ABNORMAL LOW

## 2010-12-30 LAB — ABO/RH: ABO/RH(D): O POS

## 2011-01-05 ENCOUNTER — Encounter: Payer: Self-pay | Admitting: Family Medicine

## 2011-01-05 ENCOUNTER — Ambulatory Visit (INDEPENDENT_AMBULATORY_CARE_PROVIDER_SITE_OTHER): Payer: No Typology Code available for payment source | Admitting: Family Medicine

## 2011-01-05 DIAGNOSIS — Z91199 Patient's noncompliance with other medical treatment and regimen due to unspecified reason: Secondary | ICD-10-CM

## 2011-01-05 DIAGNOSIS — E669 Obesity, unspecified: Secondary | ICD-10-CM

## 2011-01-05 DIAGNOSIS — E1159 Type 2 diabetes mellitus with other circulatory complications: Secondary | ICD-10-CM

## 2011-01-05 DIAGNOSIS — E119 Type 2 diabetes mellitus without complications: Secondary | ICD-10-CM

## 2011-01-05 DIAGNOSIS — Z79899 Other long term (current) drug therapy: Secondary | ICD-10-CM

## 2011-01-05 DIAGNOSIS — Z9119 Patient's noncompliance with other medical treatment and regimen: Secondary | ICD-10-CM

## 2011-01-05 DIAGNOSIS — E1169 Type 2 diabetes mellitus with other specified complication: Secondary | ICD-10-CM

## 2011-01-05 DIAGNOSIS — I10 Essential (primary) hypertension: Secondary | ICD-10-CM

## 2011-01-05 MED ORDER — SITAGLIPTIN PHOSPHATE 50 MG PO TABS: 50.0000 mg | ORAL_TABLET | Freq: Every day | ORAL | Status: DC

## 2011-01-05 NOTE — Progress Notes (Signed)
  Subjective:    Patient ID: [name], male    DOB: 03/11/1946, 65 y.o.   MRN: [id number]  HPI He is here for a recheck. He notes that his blood sugars in the morning are running in the 190 range. He has not made any changes in his diet or exercise pattern. He continues on medications listed in the chart. He does not smoke.   Review of Systems     Objective:   Physical Exam Alert and in no distress otherwise not examined. Hemoglobin A1c is 8.4       Assessment & Plan:   1. Diabetes mellitus  POCT HgB A1C  2. Personal history of noncompliance with medical treatment, presenting hazards to health    3. Encounter for long-term (current) use of other medications    4. Obesity (BMI 30-39.9)    5. Hypertension associated with diabetes     I discussed the fact that he has been noncompliant with improving his health. I will place him on long-acting insulin. I will stop his Glucotrol. I will cut back on his Januvia. He was instructed on proper technique and I will recheck him in 2 weeks. I explained to him that adding insulin would not change the fact he still needs to make major lifestyle changes.

## 2011-01-05 NOTE — Patient Instructions (Addendum)
Taking the Glucotrol. I will switch you to Januvia 50 mg. Stay on all of your other medications. Check your blood sugars twice a day with one of the readings being you're morning blood sugar. You should monitor your blood sugar to adjust the insulin based on you're morning blood sugar reading. You may increase the insulin by 2 units every 2 days until your blood sugar is under 120. Come back in 2 weeks and bring all your blood sugar readings with you

## 2011-01-20 ENCOUNTER — Encounter: Payer: Self-pay | Admitting: Family Medicine

## 2011-01-20 ENCOUNTER — Ambulatory Visit (INDEPENDENT_AMBULATORY_CARE_PROVIDER_SITE_OTHER): Payer: No Typology Code available for payment source | Admitting: Family Medicine

## 2011-01-20 DIAGNOSIS — E119 Type 2 diabetes mellitus without complications: Secondary | ICD-10-CM

## 2011-01-20 MED ORDER — INSULIN GLARGINE 100 UNIT/ML ~~LOC~~ SOLN: 20.0000 [IU] | Freq: Every day | SUBCUTANEOUS | Status: DC

## 2011-01-20 NOTE — Patient Instructions (Signed)
Stay on all your other medications and keep taking 20 units of the insulin. Recheck here in about 3 months

## 2011-01-20 NOTE — Progress Notes (Signed)
  Subjective:    Patient ID: [name], male    DOB: 1945-07-20, 65 y.o.   MRN: [id number]  HPI He is here for recheck. He has slowly increase his insulin and now is on 20 units. I reviewed his blood sugars and the last 3 days are below 120.   Review of Systems     Objective:   Physical Exam Alert and in no distress otherwise not examined       Assessment & Plan:  Insulin-dependent diabetes Continue on present medication dosing regimen. Encouraged him to continue to work on diet and exercise. Recheck here in 3 months.

## 2011-01-22 ENCOUNTER — Other Ambulatory Visit: Payer: Self-pay | Admitting: Family Medicine

## 2011-01-22 MED ORDER — INSULIN DETEMIR 100 UNIT/ML ~~LOC~~ SOLN: SUBCUTANEOUS | Status: DC

## 2011-01-22 NOTE — Telephone Encounter (Signed)
PER JCL LANTUS WAS IN ERROR, PT IS ON LEVEMIR AS DIRECTED, CALLED TO [facility]  JCL PLEASE SIGN OFF ON RX-LM

## 2011-02-11 ENCOUNTER — Other Ambulatory Visit: Payer: Self-pay | Admitting: Family Medicine

## 2011-02-11 NOTE — Telephone Encounter (Signed)
Is this okay to refill? 

## 2011-02-13 ENCOUNTER — Other Ambulatory Visit: Payer: Self-pay | Admitting: Family Medicine

## 2011-04-08 ENCOUNTER — Other Ambulatory Visit: Payer: Self-pay | Admitting: Family Medicine

## 2011-04-25 ENCOUNTER — Other Ambulatory Visit: Payer: Self-pay | Admitting: Family Medicine

## 2011-04-28 ENCOUNTER — Encounter: Payer: Self-pay | Admitting: Family Medicine

## 2011-04-28 ENCOUNTER — Ambulatory Visit (INDEPENDENT_AMBULATORY_CARE_PROVIDER_SITE_OTHER): Payer: Medicare Other | Admitting: Family Medicine

## 2011-04-28 DIAGNOSIS — Z79899 Other long term (current) drug therapy: Secondary | ICD-10-CM

## 2011-04-28 DIAGNOSIS — E119 Type 2 diabetes mellitus without complications: Secondary | ICD-10-CM

## 2011-04-28 DIAGNOSIS — Z91199 Patient's noncompliance with other medical treatment and regimen due to unspecified reason: Secondary | ICD-10-CM

## 2011-04-28 DIAGNOSIS — Z9119 Patient's noncompliance with other medical treatment and regimen: Secondary | ICD-10-CM

## 2011-04-28 DIAGNOSIS — I10 Essential (primary) hypertension: Secondary | ICD-10-CM

## 2011-04-28 DIAGNOSIS — E1169 Type 2 diabetes mellitus with other specified complication: Secondary | ICD-10-CM

## 2011-04-28 DIAGNOSIS — E669 Obesity, unspecified: Secondary | ICD-10-CM

## 2011-04-28 DIAGNOSIS — I152 Hypertension secondary to endocrine disorders: Secondary | ICD-10-CM

## 2011-04-28 DIAGNOSIS — E785 Hyperlipidemia, unspecified: Secondary | ICD-10-CM

## 2011-04-28 DIAGNOSIS — E1159 Type 2 diabetes mellitus with other circulatory complications: Secondary | ICD-10-CM

## 2011-04-28 LAB — CBC WITH DIFFERENTIAL/PLATELET
Eosinophils Relative: 5 % (ref 0–5)
HCT: 40.6 % (ref 39.0–52.0)
Hemoglobin: 13.3 g/dL (ref 13.0–17.0)
Lymphocytes Relative: 25 % (ref 12–46)
Lymphs Abs: 2.6 10*3/uL (ref 0.7–4.0)
MCV: 91 fL (ref 78.0–100.0)
Monocytes Absolute: 0.7 10*3/uL (ref 0.1–1.0)
Monocytes Relative: 7 % (ref 3–12)
Neutro Abs: 6.3 10*3/uL (ref 1.7–7.7)
RBC: 4.46 MIL/uL (ref 4.22–5.81)
RDW: 12.9 % (ref 11.5–15.5)
WBC: 10.2 10*3/uL (ref 4.0–10.5)

## 2011-04-28 LAB — COMPREHENSIVE METABOLIC PANEL
AST: 20 U/L (ref 0–37)
Albumin: 4.3 g/dL (ref 3.5–5.2)
BUN: 21 mg/dL (ref 6–23)
CO2: 24 mEq/L (ref 19–32)
Calcium: 9.8 mg/dL (ref 8.4–10.5)
Chloride: 101 mEq/L (ref 96–112)
Creat: 1.05 mg/dL (ref 0.50–1.35)
Glucose, Bld: 142 mg/dL — ABNORMAL HIGH (ref 70–99)
Potassium: 4.5 mEq/L (ref 3.5–5.3)

## 2011-04-28 LAB — POCT UA - MICROALBUMIN: Albumin/Creatinine Ratio, Urine, POC: 5.1

## 2011-04-28 LAB — LIPID PANEL
Cholesterol: 142 mg/dL (ref 0–200)
HDL: 43 mg/dL (ref >39)
Total CHOL/HDL Ratio: 3.3 Ratio

## 2011-04-28 LAB — POCT GLYCOSYLATED HEMOGLOBIN (HGB A1C): Hemoglobin A1C: 6.8

## 2011-04-28 MED ORDER — GLIPIZIDE 5 MG PO TABS: 5.0000 mg | ORAL_TABLET | Freq: Two times a day (BID) | ORAL | Status: DC

## 2011-04-28 NOTE — Progress Notes (Signed)
  Subjective:    Patient ID: [name], male    DOB: [birth year], 66 y.o.   MRN: [id number]  HPI He is here for a diabetes recheck. He continues on medications listed in the chart. He is now walking one hour 5 days per week. He is taking insulin and they're running in the 160 range. He does not smoke or drink. His diet is essentially unchanged. He has difficulty checking his feet due to mechanical issues with his back and hips. He gets yearly eye exams.  Review of Systems Negative except as above    Objective:   Physical Exam Alert and in no distress. Blood pressure is recorded. Exam of his feet shows normal sensation absent ankle reflex and no lesions were noted       Assessment & Plan:   1. Diabetes mellitus  POCT HgB A1C, POCT UA - Microalbumin  2. Personal history of noncompliance with medical treatment, presenting hazards to health    3. Hyperlipidemia LDL goal <70  Comprehensive metabolic panel, Lipid panel  4. Hypertension associated with diabetes  CBC with Differential, Comprehensive metabolic panel  5. Obesity (BMI 30-39.9)  CBC with Differential, Comprehensive metabolic panel, Lipid panel  6. Encounter for long-term (current) use of other medications  CBC with Differential, Comprehensive metabolic panel, Lipid panel

## 2011-04-28 NOTE — Patient Instructions (Signed)
Continue to exercise. The glipizide was renewed.

## 2011-05-06 ENCOUNTER — Other Ambulatory Visit: Payer: Self-pay | Admitting: Family Medicine

## 2011-07-10 ENCOUNTER — Other Ambulatory Visit: Payer: Self-pay | Admitting: Family Medicine

## 2011-07-15 ENCOUNTER — Encounter: Payer: Self-pay | Admitting: Family Medicine

## 2011-07-15 ENCOUNTER — Ambulatory Visit (INDEPENDENT_AMBULATORY_CARE_PROVIDER_SITE_OTHER): Payer: Medicare Other | Admitting: Family Medicine

## 2011-07-15 DIAGNOSIS — I10 Essential (primary) hypertension: Secondary | ICD-10-CM

## 2011-07-15 DIAGNOSIS — E1159 Type 2 diabetes mellitus with other circulatory complications: Secondary | ICD-10-CM

## 2011-07-15 DIAGNOSIS — Z9119 Patient's noncompliance with other medical treatment and regimen: Secondary | ICD-10-CM

## 2011-07-15 DIAGNOSIS — E1169 Type 2 diabetes mellitus with other specified complication: Secondary | ICD-10-CM

## 2011-07-15 DIAGNOSIS — N390 Urinary tract infection, site not specified: Secondary | ICD-10-CM

## 2011-07-15 DIAGNOSIS — E785 Hyperlipidemia, unspecified: Secondary | ICD-10-CM

## 2011-07-15 DIAGNOSIS — E119 Type 2 diabetes mellitus without complications: Secondary | ICD-10-CM

## 2011-07-15 DIAGNOSIS — E669 Obesity, unspecified: Secondary | ICD-10-CM

## 2011-07-15 LAB — POCT URINALYSIS DIPSTICK
Protein, UA: 30
Spec Grav, UA: 1.02
pH, UA: 6

## 2011-07-15 MED ORDER — CIPROFLOXACIN HCL 500 MG PO TABS: 500.0000 mg | ORAL_TABLET | Freq: Two times a day (BID) | ORAL | Status: DC

## 2011-07-15 NOTE — Progress Notes (Signed)
  Subjective:    Patient ID: [name], male    DOB: June 13, 1945, 66 y.o.   MRN: [id number]  HPI He is here for a recheck. He continues on medications listed in the chart. He states his blood sugars in the morning run under 120 and after meals 180. He does not smoke. He says he maintains good activity levels and admits to recent dietary indiscretion. Yesterday he had the onset of urgency and had an episode of urinary incontinence. He has had previous difficulty with this. In the past he has been seen by urology for this and usually he responds well to antibiotics.  Review of Systems     Objective:   Physical Exam Alert and in no distress. Urine dipstick did show red cells and white cells.       Assessment & Plan:   1. Diabetes mellitus    2. Hyperlipidemia LDL goal <70    3. Hypertension associated with diabetes    4. Obesity (BMI 30-39.9)    5. Personal history of noncompliance with medical treatment, presenting hazards to health    6. UTI (lower urinary tract infection)  ciprofloxacin (CIPRO) 500 MG tablet   I did increase him to do a better job of taking care of himself especially in regard to dietary indiscretion. I will place him in Cipro which has worked in the past. The ER record was reviewed from August of 2001 concerning this.

## 2011-07-15 NOTE — Progress Notes (Signed)
Addended by: Lavell Islam on: 07/15/2011 09:43 AM   Modules accepted: Orders

## 2011-08-11 ENCOUNTER — Other Ambulatory Visit: Payer: Self-pay | Admitting: Family Medicine

## 2011-08-25 ENCOUNTER — Ambulatory Visit (INDEPENDENT_AMBULATORY_CARE_PROVIDER_SITE_OTHER): Payer: Medicare Other | Admitting: Family Medicine

## 2011-08-25 ENCOUNTER — Encounter: Payer: Self-pay | Admitting: Family Medicine

## 2011-08-25 ENCOUNTER — Telehealth: Payer: Self-pay | Admitting: Internal Medicine

## 2011-08-25 VITALS — BP 138/80 | HR 64 | Wt 240.0 lb

## 2011-08-25 DIAGNOSIS — E1159 Type 2 diabetes mellitus with other circulatory complications: Secondary | ICD-10-CM

## 2011-08-25 DIAGNOSIS — E119 Type 2 diabetes mellitus without complications: Secondary | ICD-10-CM

## 2011-08-25 DIAGNOSIS — E669 Obesity, unspecified: Secondary | ICD-10-CM

## 2011-08-25 DIAGNOSIS — I10 Essential (primary) hypertension: Secondary | ICD-10-CM

## 2011-08-25 DIAGNOSIS — E785 Hyperlipidemia, unspecified: Secondary | ICD-10-CM

## 2011-08-25 DIAGNOSIS — N390 Urinary tract infection, site not specified: Secondary | ICD-10-CM

## 2011-08-25 DIAGNOSIS — E1169 Type 2 diabetes mellitus with other specified complication: Secondary | ICD-10-CM

## 2011-08-25 LAB — POCT GLYCOSYLATED HEMOGLOBIN (HGB A1C): Hemoglobin A1C: 7.4

## 2011-08-25 MED ORDER — CIPROFLOXACIN HCL 500 MG PO TABS: 500.0000 mg | ORAL_TABLET | Freq: Two times a day (BID) | ORAL | Status: AC

## 2011-08-25 NOTE — Patient Instructions (Signed)
When your blood sugars are about 120 consistently you have 3 options; your food has to change your physical activities or you increase your medications. I would prefer that the medication change be the last thing you do.

## 2011-08-25 NOTE — Telephone Encounter (Signed)
i faxed over the nutrition and diabetes form at [facility] for pt to be seen there for his diabetes They will call pt and set up the appt. Phone # [phone]

## 2011-08-25 NOTE — Progress Notes (Signed)
  Subjective:    Patient ID: [name], male    DOB: [birth year], 66 y.o.   MRN: [id number]  HPI He is here for a diabetes recheck. He did ask questions concerning his diet. He did have an initial dietary consultation when he was diagnosed with diabetes several years ago but has not had any followup. He continues on medications listed in the chart. His activity level has improved over the last several weeks. He now notes that after physical activity, his blood sugar does go down. He does not smoke or drink. He has noted recently that his blood sugar in the morning is higher than when he would like to bed. He has concerns about this. He also states that he does not think his prostate infection has gone away completely.  Review of Systems Negative except as above    Objective:   Physical Exam Alert and in no distress.      Assessment & Plan:   1. Diabetes mellitus  Ambulatory referral to diabetic education, HgB A1c  2. Hyperlipidemia LDL goal <70  Ambulatory referral to diabetic education  3. Hypertension associated with diabetes    4. Obesity (BMI 30-39.9)  Ambulatory referral to diabetic education  5. UTI (lower urinary tract infection)  ciprofloxacin (CIPRO) 500 MG tablet   I will give him another course of Cipro and he is to call me if he has any questions. I discussed his diabetes in regard to diet, exercise, medication adjustment. Strongly encouraged him to make changes in diet and exercise first and only just medications at the first 2 don't work. Strongly encouraged him to always work towards maintaining the morning blood sugar below 120.

## 2011-08-26 ENCOUNTER — Other Ambulatory Visit: Payer: Self-pay | Admitting: Family Medicine

## 2011-09-02 ENCOUNTER — Encounter: Payer: Medicare Other | Attending: Family Medicine | Admitting: *Deleted

## 2011-09-02 DIAGNOSIS — E119 Type 2 diabetes mellitus without complications: Secondary | ICD-10-CM | POA: Insufficient documentation

## 2011-09-02 DIAGNOSIS — Z713 Dietary counseling and surveillance: Secondary | ICD-10-CM | POA: Insufficient documentation

## 2011-09-03 ENCOUNTER — Encounter: Payer: Self-pay | Admitting: *Deleted

## 2011-09-03 NOTE — Patient Instructions (Signed)
Patient will attend Core Diabetes Courses II and III as scheduled or follow up prn.  

## 2011-09-03 NOTE — Progress Notes (Signed)
  Patient was seen on 09/02/11 for the first of a series of three diabetes self-management courses at the [facility]. The following learning objectives were met by the patient during this course:   Defines the role of glucose and insulin  Identifies type of diabetes and pathophysiology  Defines the diagnostic criteria for diabetes and prediabetes  States the risk factors for Type 2 Diabetes  States the symptoms of Type 2 Diabetes  Defines Type 2 Diabetes treatment goals  Defines Type 2 Diabetes treatment options  States the rationale for glucose monitoring  Identifies A1C, glucose targets, and testing times  Identifies proper sharps disposal  Defines the purpose of a diabetes food plan  Identifies carbohydrate food groups  Defines effects of carbohydrate foods on glucose levels  Identifies carbohydrate choices/grams/food labels  States benefits of physical activity and effect on glucose  Review of suggested activity guidelines  Lab Results  Component Value Date   HGBA1C 7.4% 08/25/2011   Handouts given during class include:  Type 2 Diabetes: Basics Book  My Food Plan Booklet  Blood Glucose Targets handout  Patient has established the following initial goals:  Follow a diabetes meal plan.  Lose weight.  Follow-Up Plan: Patient will attend Core Diabetes Courses II and III as scheduled or follow up prn.

## 2011-09-21 ENCOUNTER — Other Ambulatory Visit: Payer: Self-pay | Admitting: Family Medicine

## 2011-10-15 ENCOUNTER — Encounter: Payer: Medicare Other | Attending: Family Medicine | Admitting: *Deleted

## 2011-10-15 DIAGNOSIS — E119 Type 2 diabetes mellitus without complications: Secondary | ICD-10-CM | POA: Insufficient documentation

## 2011-10-15 DIAGNOSIS — Z713 Dietary counseling and surveillance: Secondary | ICD-10-CM | POA: Insufficient documentation

## 2011-10-16 ENCOUNTER — Encounter: Payer: Self-pay | Admitting: *Deleted

## 2011-10-16 NOTE — Progress Notes (Signed)
  Patient was seen on 10/15/2011 for the second of a series of three diabetes self-management courses at the [facility]. The following learning objectives were met by the patient during this course:   Explain basic nutrition maintenance and quality assurance  Describe causes, symptoms and treatment of hypoglycemia and hyperglycemia  Explain how to manage diabetes during illness  Describe the importance of good nutrition for health and healthy eating strategies  List strategies to follow meal plan when dining out  Describe the effects of alcohol on glucose and how to use it safely  Describe problem solving skills for day-to-day glucose challenges  Describe strategies to use when treatment plan needs to change  Identify important factors involved in successful weight loss  Describe ways to remain physically active  Describe the impact of regular activity on insulin resistance  Handouts given in class:  Refrigerator magnet for Sick Day Guidelines  [facility] Oral medication/insulin handout  Follow-Up Plan: Patient will attend the final class of the ADA Diabetes Self-Care Education.    

## 2011-10-29 ENCOUNTER — Encounter: Payer: Medicare Other | Attending: Family Medicine | Admitting: *Deleted

## 2011-10-29 ENCOUNTER — Encounter: Payer: Self-pay | Admitting: *Deleted

## 2011-10-29 DIAGNOSIS — Z713 Dietary counseling and surveillance: Secondary | ICD-10-CM | POA: Insufficient documentation

## 2011-10-29 DIAGNOSIS — E119 Type 2 diabetes mellitus without complications: Secondary | ICD-10-CM | POA: Insufficient documentation

## 2011-10-29 NOTE — Patient Instructions (Signed)
Check glucose 2-3 times/day 6 days/week  Walk 5 days/week to manage stress  Count carbs at most meals

## 2011-10-29 NOTE — Progress Notes (Signed)
  Patient was seen on 10/29/11 for the third of a series of three diabetes self-management courses at the [facility]. The following learning objectives were met by the patient during this course:    Describe how diabetes changes over time   Identify diabetes complications and ways to prevent them   Describe strategies that can promote heart health including lowering blood pressure and cholesterol   Describe strategies to lower dietary fat and sodium in the diet   Identify physical activities that benefit cardiovascular health   Evaluate success in meeting personal goal   Describe the belief that they can live successfully with diabetes day to day   Establish 2-3 goals that they will plan to diligently work on until they return for the free 22-month follow-up visit  The following handouts were given in class:  3 Month Follow Up Visit handout  Goal setting handout  Class evaluation form  Your patient has established the following 3 month goals for diabetes self-care:  Count carbohydrates at most  meals  Be active 1.5 hours per week by walking (also will help manage stress)  Test glucose 2-3 times a day, 6 days/week  Follow-Up Plan: Patient will attend a 3 month follow-up visit for diabetes self-management education.

## 2011-11-03 ENCOUNTER — Other Ambulatory Visit: Payer: Self-pay | Admitting: Family Medicine

## 2011-11-12 ENCOUNTER — Ambulatory Visit (INDEPENDENT_AMBULATORY_CARE_PROVIDER_SITE_OTHER): Payer: Medicare Other | Admitting: Family Medicine

## 2011-11-12 DIAGNOSIS — E119 Type 2 diabetes mellitus without complications: Secondary | ICD-10-CM

## 2011-11-12 DIAGNOSIS — E669 Obesity, unspecified: Secondary | ICD-10-CM

## 2011-11-12 NOTE — Progress Notes (Signed)
  Subjective:    Patient ID: [name], male    DOB: [birth year], 66 y.o.   MRN: [id number]  HPI He received a free bottle of Januvia 50/500. He is taking this medication essentially the same dosing regimen that I have him on and wanted to verify this.He would like to come off insulin   Review of Systems     Objective:   Physical Exam Alert and in no distress otherwise not examined       Assessment & Plan:   1. Obesity (BMI 30-39.9)   2. Diabetes mellitus    I again restated the need for him to make further diet and exercise changes to help with his weight reduction. The potential for taking him off insulin will be based on his blood sugars starting to come down and the need to stop the insulin. His main reason for doing this his economic. He is to return here in October for recheck.

## 2011-11-26 ENCOUNTER — Other Ambulatory Visit: Payer: Self-pay | Admitting: Family Medicine

## 2011-12-16 ENCOUNTER — Other Ambulatory Visit: Payer: Self-pay | Admitting: *Deleted

## 2011-12-16 DIAGNOSIS — E119 Type 2 diabetes mellitus without complications: Secondary | ICD-10-CM

## 2011-12-16 MED ORDER — INSULIN DETEMIR 100 UNIT/ML ~~LOC~~ SOLN: 20.0000 [IU] | Freq: Every day | SUBCUTANEOUS | Status: DC

## 2011-12-24 ENCOUNTER — Encounter: Payer: Self-pay | Admitting: Family Medicine

## 2011-12-24 ENCOUNTER — Ambulatory Visit (INDEPENDENT_AMBULATORY_CARE_PROVIDER_SITE_OTHER): Payer: Medicare Other | Admitting: Family Medicine

## 2011-12-24 VITALS — BP 140/70 | HR 60 | Wt 231.0 lb

## 2011-12-24 DIAGNOSIS — I10 Essential (primary) hypertension: Secondary | ICD-10-CM

## 2011-12-24 DIAGNOSIS — E1159 Type 2 diabetes mellitus with other circulatory complications: Secondary | ICD-10-CM

## 2011-12-24 DIAGNOSIS — E119 Type 2 diabetes mellitus without complications: Secondary | ICD-10-CM

## 2011-12-24 DIAGNOSIS — E1169 Type 2 diabetes mellitus with other specified complication: Secondary | ICD-10-CM

## 2011-12-24 DIAGNOSIS — E669 Obesity, unspecified: Secondary | ICD-10-CM

## 2011-12-24 DIAGNOSIS — E785 Hyperlipidemia, unspecified: Secondary | ICD-10-CM

## 2011-12-24 LAB — POCT GLYCOSYLATED HEMOGLOBIN (HGB A1C): Hemoglobin A1C: 5.8

## 2011-12-24 NOTE — Progress Notes (Signed)
  Subjective:    Patient ID: [name], male    DOB: Dec 06, 1945, 66 y.o.   MRN: [id number]  HPI He is here for recheck. He has been walking daily. His weight is down 9 pounds. He has been checking his blood sugars and they're running in the low 100s in several of them in the 80s. The medications were reviewed. He is presently on Lantus 20 units daily. He checks his feet regularly. He will get an eye exam next year. Smoking and drinking were reviewed. He has no other concerns or complaints other than URI symptoms for the last several days a history with over-the-counter medications.   Review of Systems     Objective:   Physical Exam Alert and in no distress. Hemoglobin A1c is 5.8      Assessment & Plan:   1. Diabetes mellitus  POCT glycosylated hemoglobin (Hb A1C), US Aorta Initial Medicare Screen  2. Hyperlipidemia LDL goal <70    3. Hypertension associated with diabetes    4. Obesity (BMI 30-39.9)     I congratulated him on his good work. We will start to cut back on the Lantus. Let's cut back by 2 units every 2 days and try to maintain your blood sugar 120 or lower. I also want to make sure that your blood sugar before meals does not drop below 70. If it does that then we will again use that as a cutoff and cut back on Lantus Will hold off on immunization since he has URI symptoms.

## 2011-12-24 NOTE — Patient Instructions (Addendum)
Let's start to cut back on the Lantus. Let's cut back by 2 units every 2 days and try to maintain your blood sugar 120 or lower. I also want to make sure that your blood sugar before meals does not drop below 70. If it does that then we will again use that as a cutoff and cut back on Lantus

## 2011-12-28 ENCOUNTER — Ambulatory Visit
Admission: RE | Admit: 2011-12-28 | Discharge: 2011-12-28 | Disposition: A | Discharge status: Home / Self Care | Payer: Medicare Other | Source: Ambulatory Visit | Attending: Family Medicine | Admitting: Family Medicine

## 2011-12-28 DIAGNOSIS — E119 Type 2 diabetes mellitus without complications: Secondary | ICD-10-CM

## 2012-01-14 ENCOUNTER — Other Ambulatory Visit: Payer: Medicare Other

## 2012-01-14 DIAGNOSIS — Z23 Encounter for immunization: Secondary | ICD-10-CM

## 2012-01-14 MED ORDER — INFLUENZA VIRUS VACC SPLIT PF IM SUSP: 0.5000 mL | Freq: Once | INTRAMUSCULAR | Status: AC

## 2012-01-27 ENCOUNTER — Other Ambulatory Visit: Payer: Self-pay | Admitting: Family Medicine

## 2012-01-28 ENCOUNTER — Encounter: Payer: Self-pay | Admitting: *Deleted

## 2012-01-28 ENCOUNTER — Encounter: Payer: Medicare HMO | Attending: Family Medicine | Admitting: *Deleted

## 2012-01-28 DIAGNOSIS — Z713 Dietary counseling and surveillance: Secondary | ICD-10-CM | POA: Insufficient documentation

## 2012-01-28 DIAGNOSIS — E119 Type 2 diabetes mellitus without complications: Secondary | ICD-10-CM | POA: Insufficient documentation

## 2012-01-28 NOTE — Patient Instructions (Signed)
Goals:  Follow Diabetes Meal Plan as instructed  Eat 3 meals and 2 snacks, every 3-5 hrs  Limit carbohydrate intake to 45-60 grams carbohydrate/meal  Limit carbohydrate intake to 15-30 grams carbohydrate/snack  Add lean protein foods to meals/snacks  Monitor glucose levels as instructed by your doctor  Aim for 30 mins of physical activity daily  Bring food record and glucose log to your next nutrition visit 

## 2012-01-28 NOTE — Progress Notes (Signed)
  Patient was seen on 01/28/12 for their 3 month follow-up as a part of the diabetes self-management courses at the [facility]. The following learning objectives were met by your patient during this course:  Patient's previous goals: Count carbohydrates at most meals  Be active 1.5 hours per week by walking (also will help manage stress)  Test glucose 2-3 times a day, 6 days/week He has met all of his goals.  He keeps his carbohydrates to less than 60 g/meal; walks 1 hour every day; and tests his glucose 3-4 times/day.  Most values are WNL, but his night time glucose is usually high due to late dinner or high carb snack  Patient self reports the following:  Diabetes control has improved since diabetes self-management training: reports improvement Number of days blood glucose is >200: 3 times a week Last MD appointment for diabetes: 2 weeks ago Changes in treatment plan: reduce insulin  Confidence with ability to manage diabetes: yes Areas for improvement with diabetes self-care: smaller night time snack Willingness to participate in diabetes support group: maybe  Please see Diabetes Flow sheet for findings related to patient's self-care.  Follow-Up Plan: Patient is eligible for a "free" 30 minute diabetes self-care appointment in the next year. Patient scheduled appointment today

## 2012-02-20 ENCOUNTER — Other Ambulatory Visit: Payer: Self-pay | Admitting: Family Medicine

## 2012-02-22 ENCOUNTER — Telehealth: Payer: Self-pay | Admitting: Family Medicine

## 2012-02-22 MED ORDER — SITAGLIPTIN PHOSPHATE 100 MG PO TABS: 100.0000 mg | ORAL_TABLET | Freq: Every day | ORAL | Status: DC

## 2012-02-22 NOTE — Telephone Encounter (Signed)
Patient has a coupon to get Januvia 100 mg. He knows to take this at a half a pill per day.

## 2012-03-08 ENCOUNTER — Other Ambulatory Visit: Payer: Self-pay

## 2012-03-08 MED ORDER — SITAGLIPTIN PHOSPHATE 100 MG PO TABS: 100.0000 mg | ORAL_TABLET | Freq: Every day | ORAL | Status: DC

## 2012-03-08 NOTE — Telephone Encounter (Signed)
Pt wanted written rx so he can get one mth free

## 2012-03-17 ENCOUNTER — Other Ambulatory Visit: Payer: Self-pay | Admitting: Family Medicine

## 2012-03-20 ENCOUNTER — Other Ambulatory Visit: Payer: Self-pay | Admitting: Family Medicine

## 2012-04-01 ENCOUNTER — Other Ambulatory Visit: Payer: Self-pay | Admitting: Family Medicine

## 2012-04-14 ENCOUNTER — Telehealth: Payer: Self-pay | Admitting: Family Medicine

## 2012-04-14 MED ORDER — CIPROFLOXACIN HCL 500 MG PO TABS: 500.0000 mg | ORAL_TABLET | Freq: Two times a day (BID) | ORAL | Status: DC

## 2012-04-14 NOTE — Telephone Encounter (Signed)
Cipro called in for treatment of prostate symptoms. He has had difficulty with this in the past.

## 2012-04-14 NOTE — Telephone Encounter (Signed)
PT CALLED AND STATED THAT HE WAS ONCE AGAIN HAVING ISSUES WITH HIS PROSTATE. HE STATES THAT THIS IS AN ISSUE HE HAS HAD MANY TIMES AND HAS BEEN TREATED FOR MANY TIMES. PT IS REQUESTING THAT CIPRO, WHICH IS WHAT IS USUALLY GETS, BE CALLED IN FOR HIM. PT USES [facility] [address] PLEASE CALL PT AND INFORM.

## 2012-04-14 NOTE — Telephone Encounter (Signed)
Notified pt. 

## 2012-04-14 NOTE — Telephone Encounter (Signed)
Tell him that I did call the medication for 2 weeks. Have him call me in 2 weeks if he still having symptoms.

## 2012-04-20 ENCOUNTER — Other Ambulatory Visit: Payer: Self-pay | Admitting: Family Medicine

## 2012-04-25 ENCOUNTER — Ambulatory Visit (INDEPENDENT_AMBULATORY_CARE_PROVIDER_SITE_OTHER): Payer: Medicare HMO | Admitting: Family Medicine

## 2012-04-25 ENCOUNTER — Encounter: Payer: Self-pay | Admitting: Family Medicine

## 2012-04-25 VITALS — BP 130/86 | HR 76 | Wt 233.0 lb

## 2012-04-25 DIAGNOSIS — N410 Acute prostatitis: Secondary | ICD-10-CM

## 2012-04-25 DIAGNOSIS — I10 Essential (primary) hypertension: Secondary | ICD-10-CM

## 2012-04-25 DIAGNOSIS — E1169 Type 2 diabetes mellitus with other specified complication: Secondary | ICD-10-CM

## 2012-04-25 DIAGNOSIS — E119 Type 2 diabetes mellitus without complications: Secondary | ICD-10-CM

## 2012-04-25 DIAGNOSIS — Z79899 Other long term (current) drug therapy: Secondary | ICD-10-CM

## 2012-04-25 DIAGNOSIS — E785 Hyperlipidemia, unspecified: Secondary | ICD-10-CM

## 2012-04-25 DIAGNOSIS — E1159 Type 2 diabetes mellitus with other circulatory complications: Secondary | ICD-10-CM

## 2012-04-25 DIAGNOSIS — E669 Obesity, unspecified: Secondary | ICD-10-CM

## 2012-04-25 LAB — COMPREHENSIVE METABOLIC PANEL
ALT: 36 U/L (ref 0–53)
CO2: 23 mEq/L (ref 19–32)
Calcium: 9.3 mg/dL (ref 8.4–10.5)
Chloride: 106 mEq/L (ref 96–112)
Creat: 1.03 mg/dL (ref 0.50–1.35)
Sodium: 140 mEq/L (ref 135–145)
Total Protein: 6.7 g/dL (ref 6.0–8.3)

## 2012-04-25 LAB — POCT URINALYSIS DIPSTICK
Ketones, UA: NEGATIVE
Urobilinogen, UA: NEGATIVE
pH, UA: 5

## 2012-04-25 LAB — POCT GLYCOSYLATED HEMOGLOBIN (HGB A1C): Hemoglobin A1C: 6.5

## 2012-04-25 LAB — POCT UA - MICROALBUMIN: Microalbumin Ur, POC: 91.2 mg/dL

## 2012-04-25 LAB — LIPID PANEL: Total CHOL/HDL Ratio: 2.8 Ratio

## 2012-04-25 MED ORDER — INSULIN DETEMIR 100 UNIT/ML ~~LOC~~ SOLN: 20.0000 [IU] | Freq: Every day | SUBCUTANEOUS | Status: DC

## 2012-04-25 MED ORDER — CIPROFLOXACIN HCL 500 MG PO TABS: 500.0000 mg | ORAL_TABLET | Freq: Two times a day (BID) | ORAL | Status: DC

## 2012-04-25 NOTE — Patient Instructions (Signed)
Take the Cipro for another 2 weeks and if you're still having a symptoms call me.

## 2012-04-25 NOTE — Progress Notes (Signed)
  Subjective:    Patient ID: [name], male    DOB: 06-18-1945, 67 y.o.   MRN: [id number]  HPI He is here for recheck. He has had recent difficulty with prostate as well as bladder symptoms. He has had some lower abdominal pain as well as some urgency incontinence and dysuria. He states that he has 80% better and all the symptoms since she was placed on the Cipro. He has a previous history of difficulty with prostate trouble. He states that this has interfered with his ability to exercise and has gained several pounds. He has increased his insulin to 14 units to help keep his blood sugars in the good range. He plans to get an eye exam in March. He does check his feet but is not interested in having a foot exam today. Smoking and drinking were reviewed. He continues on medications listed in the chart.   Review of Systems     Objective:   Physical Exam Alert and in no distress. Urine microscopic showed bacteria as well as white blood cells. Hemoglobin A1c 6.5.       Assessment & Plan:   1. DM (diabetes mellitus)  HgB A1c, POCT Urinalysis Dipstick, POCT UA - Microalbumin  2. Prostatitis, acute  ciprofloxacin (CIPRO) 500 MG tablet, CULTURE, URINE COMPREHENSIVE  3. Type II or unspecified type diabetes mellitus without mention of complication, not stated as uncontrolled  insulin detemir (LEVEMIR) 100 UNIT/ML injection  4. Hyperlipidemia LDL goal <70  Lipid panel  5. Hypertension associated with diabetes    6. Obesity (BMI 30-39.9)    7. Encounter for long-term (current) use of other medications  Lipid panel, CBC with Differential, Comprehensive metabolic panel   he is to call me when he finishes the second round of antibiotics. I will also do a culture on his urine. Again discussed the need for him to become more physically active, work on weight loss. Discussed the fact that this would help reduce the need for some of his medications and therefore cut down on his medical costs.

## 2012-04-26 LAB — CBC WITH DIFFERENTIAL/PLATELET
Eosinophils Relative: 3 % (ref 0–5)
Hemoglobin: 12.8 g/dL — ABNORMAL LOW (ref 13.0–17.0)
Lymphocytes Relative: 20 % (ref 12–46)
Lymphs Abs: 2.7 10*3/uL (ref 0.7–4.0)
MCV: 88.6 fL (ref 78.0–100.0)
Monocytes Relative: 7 % (ref 3–12)
Platelets: 460 10*3/uL — ABNORMAL HIGH (ref 150–400)
RBC: 4.22 MIL/uL (ref 4.22–5.81)
WBC: 13.4 10*3/uL — ABNORMAL HIGH (ref 4.0–10.5)

## 2012-04-26 NOTE — Progress Notes (Signed)
Quick Note:  The blood work is normal ______ 

## 2012-04-28 ENCOUNTER — Other Ambulatory Visit: Payer: Self-pay

## 2012-04-28 MED ORDER — AMOXICILLIN-POT CLAVULANATE 875-125 MG PO TABS: 1.0000 | ORAL_TABLET | Freq: Two times a day (BID) | ORAL | Status: DC

## 2012-04-28 NOTE — Progress Notes (Signed)
Quick Note:  Let him know that the culture showed that the medicine he is on is not working. Switch him to Augmentin for 2 weeks and then recheck his urine at that point. ______

## 2012-04-28 NOTE — Progress Notes (Signed)
Quick Note:  CALLED PT HE WAS NOTIFIED OF URINE CULTURE AND SWITCH OF MEDS.AND TO RECHECK IN 2 WEEKS PT VERBALIZED UNDERSTANDING  ______

## 2012-04-28 NOTE — Progress Notes (Signed)
Quick Note:  PT NOTIFIED OF LABS BEING NORMAL AND PT VERBALIZED UNDERSTANDING  ______

## 2012-04-28 NOTE — Telephone Encounter (Signed)
ENT IN AUGMENTIN AS PER JCL FOR 14 DAYS

## 2012-04-29 ENCOUNTER — Encounter: Payer: Medicare HMO | Attending: Family Medicine | Admitting: *Deleted

## 2012-04-29 DIAGNOSIS — Z713 Dietary counseling and surveillance: Secondary | ICD-10-CM | POA: Insufficient documentation

## 2012-04-29 DIAGNOSIS — E119 Type 2 diabetes mellitus without complications: Secondary | ICD-10-CM | POA: Insufficient documentation

## 2012-04-29 NOTE — Progress Notes (Signed)
Patient was seen on 04/29/12 for their 6 month follow-up as a part of the diabetes self-management courses at the [facility].   Latest HbA1c 6.5 %, but had it checked a month prior and it was 5.8%.  [name] has been very sick this past month and it has affected his diabetes management.  He was not able to control the infection in his prostate with the medication prescribed and just got a new medication yesterday  At his last nutrition visit 3 months ago, he reported excellent control. He was physically active and had lost some weight and his glucose log showed well-controlled blood sugars.  His HbA1c prior to sickness was low.  He feels confident that after he is well, his diabetes will be back under control. He has had to increase his insulin this month, but he was able to bring it down prior to sickness.  He has good understanding of carb counting and the factors that affect glucose control.  Even with illness, his HbA1c is less than 7%

## 2012-05-12 ENCOUNTER — Ambulatory Visit (INDEPENDENT_AMBULATORY_CARE_PROVIDER_SITE_OTHER): Payer: Medicare HMO | Admitting: Family Medicine

## 2012-05-12 ENCOUNTER — Encounter: Payer: Self-pay | Admitting: Family Medicine

## 2012-05-12 VITALS — BP 130/80 | HR 65 | Wt 236.0 lb

## 2012-05-12 DIAGNOSIS — R8290 Unspecified abnormal findings in urine: Secondary | ICD-10-CM

## 2012-05-12 DIAGNOSIS — R82998 Other abnormal findings in urine: Secondary | ICD-10-CM

## 2012-05-12 LAB — POCT URINALYSIS DIPSTICK
Glucose, UA: NEGATIVE
Ketones, UA: NEGATIVE
Spec Grav, UA: 1.015

## 2012-05-12 NOTE — Progress Notes (Signed)
  Subjective:    Patient ID: [name], male    DOB: 1946-02-26, 67 y.o.   MRN: [id number]  HPI He is here for recheck. He states that he is having no urinary or prostate type symptoms at the present time. He usually gets 2 infections per year.   Review of Systems     Objective:   Physical Exam Alert and in no distress       Assessment & Plan:  Abnormal urine - Plan: POCT Urinalysis Dipstick recommend he call me the next time he starts having symptoms. He has one person who I will potentially get antibiotics to without seeing

## 2012-05-13 ENCOUNTER — Other Ambulatory Visit: Payer: Self-pay | Admitting: Family Medicine

## 2012-05-15 ENCOUNTER — Other Ambulatory Visit: Payer: Self-pay | Admitting: Family Medicine

## 2012-05-17 ENCOUNTER — Other Ambulatory Visit: Payer: Self-pay | Admitting: Family Medicine

## 2012-05-25 ENCOUNTER — Telehealth: Payer: Self-pay | Admitting: Internal Medicine

## 2012-05-25 MED ORDER — SIMVASTATIN 40 MG PO TABS: 40.0000 mg | ORAL_TABLET | Freq: Every day | ORAL | Status: DC

## 2012-05-25 NOTE — Telephone Encounter (Signed)
Sent in med

## 2012-05-25 NOTE — Telephone Encounter (Signed)
This meant to go to cheri

## 2012-06-11 ENCOUNTER — Other Ambulatory Visit: Payer: Self-pay | Admitting: Family Medicine

## 2012-06-15 ENCOUNTER — Telehealth: Payer: Self-pay | Admitting: Family Medicine

## 2012-06-16 ENCOUNTER — Other Ambulatory Visit: Payer: Self-pay

## 2012-06-16 MED ORDER — GLIPIZIDE ER 5 MG PO TB24: ORAL_TABLET | ORAL | Status: DC

## 2012-06-16 NOTE — Telephone Encounter (Signed)
DONE

## 2012-06-16 NOTE — Telephone Encounter (Signed)
SENT IN GLIPIZIDE PER FAX

## 2012-06-25 ENCOUNTER — Emergency Department (HOSPITAL_COMMUNITY): Payer: Medicare HMO

## 2012-06-25 ENCOUNTER — Inpatient Hospital Stay (HOSPITAL_COMMUNITY)
Admission: EM | Admit: 2012-06-25 | Discharge: 2012-06-30 | DRG: 871 | Disposition: A | Discharge status: Home / Self Care | Payer: Medicare HMO | Attending: Internal Medicine | Admitting: Internal Medicine

## 2012-06-25 ENCOUNTER — Encounter (HOSPITAL_COMMUNITY): Payer: Self-pay | Admitting: Emergency Medicine

## 2012-06-25 DIAGNOSIS — Z794 Long term (current) use of insulin: Secondary | ICD-10-CM

## 2012-06-25 DIAGNOSIS — J9600 Acute respiratory failure, unspecified whether with hypoxia or hypercapnia: Secondary | ICD-10-CM | POA: Diagnosis present

## 2012-06-25 DIAGNOSIS — J189 Pneumonia, unspecified organism: Secondary | ICD-10-CM | POA: Diagnosis present

## 2012-06-25 DIAGNOSIS — N179 Acute kidney failure, unspecified: Secondary | ICD-10-CM | POA: Diagnosis present

## 2012-06-25 DIAGNOSIS — N401 Enlarged prostate with lower urinary tract symptoms: Secondary | ICD-10-CM | POA: Diagnosis present

## 2012-06-25 DIAGNOSIS — D638 Anemia in other chronic diseases classified elsewhere: Secondary | ICD-10-CM | POA: Diagnosis present

## 2012-06-25 DIAGNOSIS — N138 Other obstructive and reflux uropathy: Secondary | ICD-10-CM | POA: Diagnosis present

## 2012-06-25 DIAGNOSIS — I10 Essential (primary) hypertension: Secondary | ICD-10-CM | POA: Diagnosis present

## 2012-06-25 DIAGNOSIS — Z79899 Other long term (current) drug therapy: Secondary | ICD-10-CM

## 2012-06-25 DIAGNOSIS — E785 Hyperlipidemia, unspecified: Secondary | ICD-10-CM | POA: Diagnosis present

## 2012-06-25 DIAGNOSIS — R339 Retention of urine, unspecified: Secondary | ICD-10-CM | POA: Diagnosis present

## 2012-06-25 DIAGNOSIS — G929 Unspecified toxic encephalopathy: Secondary | ICD-10-CM | POA: Diagnosis present

## 2012-06-25 DIAGNOSIS — D72829 Elevated white blood cell count, unspecified: Secondary | ICD-10-CM | POA: Diagnosis present

## 2012-06-25 DIAGNOSIS — E119 Type 2 diabetes mellitus without complications: Secondary | ICD-10-CM | POA: Diagnosis present

## 2012-06-25 DIAGNOSIS — N390 Urinary tract infection, site not specified: Secondary | ICD-10-CM | POA: Diagnosis present

## 2012-06-25 DIAGNOSIS — A419 Sepsis, unspecified organism: Principal | ICD-10-CM | POA: Diagnosis present

## 2012-06-25 DIAGNOSIS — E669 Obesity, unspecified: Secondary | ICD-10-CM | POA: Diagnosis present

## 2012-06-25 DIAGNOSIS — E876 Hypokalemia: Secondary | ICD-10-CM | POA: Diagnosis present

## 2012-06-25 DIAGNOSIS — E1159 Type 2 diabetes mellitus with other circulatory complications: Secondary | ICD-10-CM | POA: Diagnosis present

## 2012-06-25 DIAGNOSIS — Z713 Dietary counseling and surveillance: Secondary | ICD-10-CM

## 2012-06-25 DIAGNOSIS — G92 Toxic encephalopathy: Secondary | ICD-10-CM | POA: Diagnosis present

## 2012-06-25 DIAGNOSIS — N170 Acute kidney failure with tubular necrosis: Secondary | ICD-10-CM | POA: Diagnosis present

## 2012-06-25 LAB — COMPREHENSIVE METABOLIC PANEL
ALT: 22 U/L (ref 0–53)
AST: 24 U/L (ref 0–37)
Alkaline Phosphatase: 79 U/L (ref 39–117)
CO2: 23 mEq/L (ref 19–32)
Chloride: 99 mEq/L (ref 96–112)
Creatinine, Ser: 2.3 mg/dL — ABNORMAL HIGH (ref 0.50–1.35)
GFR calc non Af Amer: 28 mL/min — ABNORMAL LOW (ref >90)
Potassium: 3.8 mEq/L (ref 3.5–5.1)
Sodium: 135 mEq/L (ref 135–145)
Total Bilirubin: 1.3 mg/dL — ABNORMAL HIGH (ref 0.3–1.2)

## 2012-06-25 LAB — CBC WITH DIFFERENTIAL/PLATELET
Basophils Absolute: 0 10*3/uL (ref 0.0–0.1)
HCT: 34.9 % — ABNORMAL LOW (ref 39.0–52.0)
Lymphocytes Relative: 1 % — ABNORMAL LOW (ref 12–46)
Monocytes Absolute: 1.1 10*3/uL — ABNORMAL HIGH (ref 0.1–1.0)
Neutro Abs: 23.5 10*3/uL — ABNORMAL HIGH (ref 1.7–7.7)
RDW: 13.4 % (ref 11.5–15.5)
WBC: 24.9 10*3/uL — ABNORMAL HIGH (ref 4.0–10.5)

## 2012-06-25 LAB — LIPASE, BLOOD: Lipase: 13 U/L (ref 11–59)

## 2012-06-25 LAB — URINE MICROSCOPIC-ADD ON

## 2012-06-25 LAB — URINALYSIS, ROUTINE W REFLEX MICROSCOPIC
Glucose, UA: NEGATIVE mg/dL
Ketones, ur: NEGATIVE mg/dL
pH: 5.5 (ref 5.0–8.0)

## 2012-06-25 LAB — TROPONIN I: Troponin I: <0.3 ng/mL (ref <0.30)

## 2012-06-25 MED ORDER — AZITHROMYCIN 500 MG IV SOLR
500.0000 mg | Freq: Once | INTRAVENOUS | Status: AC
  Administered 2012-06-26: 500 mg via INTRAVENOUS
  Filled 2012-06-25: qty 500

## 2012-06-25 MED ORDER — SODIUM CHLORIDE 0.9 % IV SOLN
Freq: Once | INTRAVENOUS | Status: AC
  Administered 2012-06-25: 30 mL/h via INTRAVENOUS

## 2012-06-25 MED ORDER — DEXTROSE 5 % IV SOLN
1.0000 g | Freq: Once | INTRAVENOUS | Status: AC
  Administered 2012-06-25: 1 g via INTRAVENOUS
  Filled 2012-06-25: qty 10

## 2012-06-25 MED ORDER — INSULIN DETEMIR 100 UNIT/ML ~~LOC~~ SOLN
20.0000 [IU] | Freq: Once | SUBCUTANEOUS | Status: AC
  Administered 2012-06-25: 20 [IU] via SUBCUTANEOUS
  Filled 2012-06-25: qty 0.2

## 2012-06-25 MED ORDER — ACETAMINOPHEN 325 MG PO TABS
650.0000 mg | ORAL_TABLET | Freq: Once | ORAL | Status: AC
  Administered 2012-06-25: 650 mg via ORAL
  Filled 2012-06-25: qty 2

## 2012-06-25 MED ORDER — SODIUM CHLORIDE 0.9 % IV BOLUS (SEPSIS)
1000.0000 mL | Freq: Once | INTRAVENOUS | Status: AC
  Administered 2012-06-25: 1000 mL via INTRAVENOUS

## 2012-06-25 NOTE — ED Provider Notes (Signed)
History     CSN: [id number]  Arrival date & time 06/25/12  2037   First MD Initiated Contact with Patient 06/25/12 2054      Chief Complaint  Patient presents with  . Altered Mental Status    (Consider location/radiation/quality/duration/timing/severity/associated sxs/prior treatment) HPI History provided by pt and his wife.  Per patient's wife, patient has been complaining of exhaustion and nausea x 2 days.  Came home early from work today and took a nap for several hours.  Upon waking at 6:45pm, he had respiratory distress, was unable to sit up (falling to the right), was initially unable to speak, disoriented to place, and vomited twice.  Symptoms improved en route to hospital and he is currently mentating at baseline.  Pt has no complaints at this time.  Has had mild SOB over the past couple days. Denies headache, vision changes, extremity weakness/paresthesias, CP, palpitations, abd pain, vomiting, diarrhea, hematochezia/melena, urinary sx.  No recent head trauma.  No recent medication changes.  H/o HTN and diabetes.  No h/o lung disease.  Past Medical History  Diagnosis Date  . Arthritis   . Diabetes mellitus   . Allergy   . BPH (benign prostatic hyperplasia)   . Hypertension   . ED (erectile dysfunction)   . History of colonic polyps   . Dyslipidemia   . Obesity     Past Surgical History  Procedure Laterality Date  . Back surgery  2009    OLIN  . Revision total hip arthroplasty  11/07    RIGHT OLIN  . Colonoscopy  2006    HAYES  . Tonsillectomy      Pt was 67 yrs old  . Hernia repair  1996    History reviewed. No pertinent family history.  History  Substance Use Topics  . Smoking status: Former Smoker    Quit date: 03/23/1986  . Smokeless tobacco: Never Used  . Alcohol Use: No      Review of Systems  All other systems reviewed and are negative.    Allergies  Sulfa antibiotics and Morphine and related  Home Medications   Current Outpatient Rx   Name  Route  Sig  Dispense  Refill  . aspirin 325 MG tablet   Oral   Take 325 mg by mouth daily.           . cholecalciferol (VITAMIN D) 1000 UNITS tablet   Oral   Take 1,000 Units by mouth daily.         Marland Kitchen doxazosin (CARDURA) 2 MG tablet   Oral   Take 2 mg by mouth every morning.         Marland Kitchen glipiZIDE (GLUCOTROL XL) 5 MG 24 hr tablet   Oral   Take 5 mg by mouth 2 (two) times daily.         Marland Kitchen glucosamine-chondroitin 500-400 MG tablet   Oral   Take 1 tablet by mouth 3 (three) times daily.           . insulin detemir (LEVEMIR) 100 UNIT/ML injection   Subcutaneous   Inject 20 Units into the skin at bedtime.         Marland Kitchen lisinopril-hydrochlorothiazide (PRINZIDE,ZESTORETIC) 20-12.5 MG per tablet   Oral   Take 1 tablet by mouth every morning.         . metFORMIN (GLUCOPHAGE) 1000 MG tablet   Oral   Take 1,000 mg by mouth 2 (two) times daily with a meal.         .  Multiple Vitamins-Minerals (MULTIVITAMIN WITH MINERALS) tablet   Oral   Take 1 tablet by mouth daily.           . simvastatin (ZOCOR) 40 MG tablet   Oral   Take 1 tablet (40 mg total) by mouth at bedtime.   30 tablet   4   . sitaGLIPtin (JANUVIA) 100 MG tablet   Oral   Take 100 mg by mouth every morning.            BP 134/55  Pulse 99  Temp(Src) 101.2 F (38.4 C) (Oral)  Resp 20  Ht 5\' 8"  (1.727 m)  Wt 240 lb (108.863 kg)  BMI 36.5 kg/m2  SpO2 96%  Physical Exam  Nursing note and vitals reviewed. Constitutional: He is oriented to person, place, and time. He appears well-developed and well-nourished. No distress.  febrile  HENT:  Head: Normocephalic and atraumatic.  Eyes:  Normal appearance  Neck: Normal range of motion.  Cardiovascular: Normal rate, regular rhythm and intact distal pulses.   Pulmonary/Chest: Effort normal and breath sounds normal.  Hypoxic (93% rm air).  RR 30.   Abdominal: Soft. Bowel sounds are normal. He exhibits no distension and no mass. There is no  tenderness. There is no rebound and no guarding.  Genitourinary:  No CVA ttp  Musculoskeletal: Normal range of motion.  Neurological: He is alert and oriented to person, place, and time. No sensory deficit. Coordination normal.  CN 3-12 intact.  No nystagmus.  5/5 and equal upper and lower extremity strength.  No past pointing.  Nursing staff ambulated and report nml gait and that patient did not become dizzy.   Skin: Skin is warm and dry. No rash noted.  Psychiatric: He has a normal mood and affect. His behavior is normal.    ED Course  Procedures (including critical care time)   Date: 06/25/2012  Rate: 98  Rhythm: normal sinus rhythm  QRS Axis: left  Intervals: normal  ST/T Wave abnormalities: nonspecific T wave changes  Conduction Disutrbances:none  Narrative Interpretation:   Old EKG Reviewed: none available   Labs Reviewed  CBC WITH DIFFERENTIAL - Abnormal; Notable for the following:    WBC 24.9 (*)    RBC 3.90 (*)    Hemoglobin 11.9 (*)    HCT 34.9 (*)    Neutrophils Relative 94 (*)    Neutro Abs 23.5 (*)    Lymphocytes Relative 1 (*)    Lymphs Abs 0.4 (*)    Monocytes Absolute 1.1 (*)    All other components within normal limits  COMPREHENSIVE METABOLIC PANEL - Abnormal; Notable for the following:    Glucose, Bld 291 (*)    BUN 34 (*)    Creatinine, Ser 2.30 (*)    Albumin 3.1 (*)    Total Bilirubin 1.3 (*)    GFR calc non Af Amer 28 (*)    GFR calc Af Amer 32 (*)    All other components within normal limits  URINALYSIS, ROUTINE W REFLEX MICROSCOPIC - Abnormal; Notable for the following:    Color, Urine AMBER (*)    APPearance CLOUDY (*)    Hgb urine dipstick MODERATE (*)    Bilirubin Urine SMALL (*)    Protein, ur 100 (*)    Leukocytes, UA MODERATE (*)    All other components within normal limits  URINE MICROSCOPIC-ADD ON - Abnormal; Notable for the following:    Bacteria, UA MANY (*)    All other components within normal limits  CBC WITH DIFFERENTIAL -  Abnormal; Notable for the following:    WBC 25.4 (*)    RBC 3.59 (*)    Hemoglobin 10.9 (*)    HCT 32.4 (*)    Neutrophils Relative 91 (*)    Lymphocytes Relative 3 (*)    Neutro Abs 23.1 (*)    Monocytes Absolute 1.5 (*)    All other components within normal limits  BASIC METABOLIC PANEL - Abnormal; Notable for the following:    Sodium 132 (*)    Glucose, Bld 297 (*)    BUN 36 (*)    Creatinine, Ser 2.21 (*)    Calcium 8.2 (*)    GFR calc non Af Amer 29 (*)    GFR calc Af Amer 33 (*)    All other components within normal limits  GLUCOSE, CAPILLARY - Abnormal; Notable for the following:    Glucose-Capillary 264 (*)    All other components within normal limits  GLUCOSE, CAPILLARY - Abnormal; Notable for the following:    Glucose-Capillary 238 (*)    All other components within normal limits  GLUCOSE, CAPILLARY - Abnormal; Notable for the following:    Glucose-Capillary 215 (*)    All other components within normal limits  GLUCOSE, CAPILLARY - Abnormal; Notable for the following:    Glucose-Capillary 218 (*)    All other components within normal limits  GLUCOSE, CAPILLARY - Abnormal; Notable for the following:    Glucose-Capillary 211 (*)    All other components within normal limits  CULTURE, EXPECTORATED SPUTUM-ASSESSMENT  URINE CULTURE  CULTURE, BLOOD (ROUTINE X 2)  CULTURE, BLOOD (ROUTINE X 2)  GRAM STAIN  PROTIME-INR  LACTIC ACID, PLASMA  LIPASE, BLOOD  TROPONIN I  HIV ANTIBODY (ROUTINE TESTING)  LEGIONELLA ANTIGEN, URINE  STREP PNEUMONIAE URINARY ANTIGEN  CBC  BASIC METABOLIC PANEL   Dg Chest 2 View  06/25/2012  *RADIOLOGY REPORT*  Clinical Data: Altered mental status.  Shortness of breath. Diabetes and hypertension.  CHEST - 2 VIEW  Comparison: 12/27/2008  Findings: Borderline heart size with normal pulmonary vascularity. Linear atelectasis or fibrosis in the left lung base appears similar to previous study, favor fibrosis.  Peribronchial thickening and  interstitial changes suggest chronic bronchitis.  No focal consolidation.  No pneumothorax.  Mediastinal contours appear intact.  Calcification of the aorta.  Degenerative changes in the spine.  No significant change since previous study.  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  No evidence of active consolidation.   Original Report Authenticated By: Burman Nieves, M.D.    Ct Head Wo Contrast  06/25/2012  *RADIOLOGY REPORT*  Clinical Data: Altered mental status.  CT HEAD WITHOUT CONTRAST  Technique:  Contiguous axial images were obtained from the base of the skull through the vertex without contrast.  Comparison: None.  Findings: Mild diffuse cerebral atrophy.  Low attenuation changes in the deep white matter consistent with small vessel ischemia. Ventricular dilatation consistent with central atrophy.  No mass effect or midline shift.  No abnormal extra-axial fluid collections.  Gray-white matter junctions are distinct.  Basal cisterns are not effaced.  No evidence of acute intracranial hemorrhage.  No depressed skull fractures.  There is opacification and hypoplasia of the right maxillary antrum with opacification of some of the ethmoid air cells bilaterally.  Mastoid air cells are not opacified.  Vascular calcifications.  IMPRESSION: No acute intracranial abnormalities.  Chronic atrophy and small vessel ischemic changes.  Opacification of right  maxillary antrum and ethmoid air cells.   Original Report Authenticated By: Burman Nieves, M.D.      1. UTI (lower urinary tract infection)   2. CAP (community acquired pneumonia)   3. AKI (acute kidney injury)   4. Diabetes mellitus   5. Hypertension associated with diabetes   6. Sepsis secondary to UTI       MDM  Pt w/ h/o HTN and diabetes brought to ED by EMS for [facility].  Woke from nap in respiratory distress w/ vomiting and disorientation.  Was falling to the right when he tried to sit up in  bed. Currently mentating at baseline and no complaints.  No recent head trauma, medication changes or illnesses, w/ exception of nausea, mild SOB and fatigue x 2 days.  On exam, febrile, mildly hypoxic and tachypneic, breath sounds nml, abd benign, no focal neuro deficits, including ataxia.  EKG shows sinus rhythm w/ T wave inversions, but no prior for comparison.  Labs, CXR and CT head pending.  9:48 PM   Labs sig for leukocytosis, AKI that appears to be pre-renal and hyperglycemia.  Troponin and lactic acid pending.  CT head and CXR neg.  Will treat pt for clinical CAP w/ zithromax and rocephin and consult [facility] for admission.  Pt receiving IVF as well as home dose of insulin.    Troponin and lactate nml.  U/A positive for infection.  Pt currently stable  10:59 PM   Otilio Miu, PA-C 06/26/12 2056

## 2012-06-25 NOTE — ED Notes (Signed)
WUJ:[id number]<JY> Expected date:<BR> Expected time:<BR> Means of arrival:<BR> Comments:<BR> Medic, 66 M, Altered LOC

## 2012-06-25 NOTE — ED Notes (Signed)
Pt received 8 mg of Zofran by EMS.

## 2012-06-25 NOTE — ED Notes (Signed)
Report per EMS, pt found at home with altered LOC, fever and vomiting. EMS reports that pt started to look better when he was put in the truck.

## 2012-06-26 ENCOUNTER — Encounter (HOSPITAL_COMMUNITY): Payer: Self-pay | Admitting: Oncology

## 2012-06-26 DIAGNOSIS — J189 Pneumonia, unspecified organism: Secondary | ICD-10-CM | POA: Diagnosis present

## 2012-06-26 DIAGNOSIS — N390 Urinary tract infection, site not specified: Secondary | ICD-10-CM | POA: Diagnosis present

## 2012-06-26 DIAGNOSIS — N179 Acute kidney failure, unspecified: Secondary | ICD-10-CM

## 2012-06-26 DIAGNOSIS — E1169 Type 2 diabetes mellitus with other specified complication: Secondary | ICD-10-CM

## 2012-06-26 DIAGNOSIS — I10 Essential (primary) hypertension: Secondary | ICD-10-CM

## 2012-06-26 DIAGNOSIS — A419 Sepsis, unspecified organism: Secondary | ICD-10-CM | POA: Diagnosis present

## 2012-06-26 DIAGNOSIS — E119 Type 2 diabetes mellitus without complications: Secondary | ICD-10-CM

## 2012-06-26 LAB — GLUCOSE, CAPILLARY
Glucose-Capillary: 215 mg/dL — ABNORMAL HIGH (ref 70–99)
Glucose-Capillary: 218 mg/dL — ABNORMAL HIGH (ref 70–99)
Glucose-Capillary: 211 mg/dL — ABNORMAL HIGH (ref 70–99)

## 2012-06-26 LAB — CBC WITH DIFFERENTIAL/PLATELET
Basophils Absolute: 0 10*3/uL (ref 0.0–0.1)
Eosinophils Absolute: 0 10*3/uL (ref 0.0–0.7)
Lymphs Abs: 0.8 10*3/uL (ref 0.7–4.0)
MCHC: 33.6 g/dL (ref 30.0–36.0)
MCV: 90.3 fL (ref 78.0–100.0)
Monocytes Relative: 6 % (ref 3–12)
Platelets: 184 10*3/uL (ref 150–400)
RDW: 13.6 % (ref 11.5–15.5)
WBC Morphology: INCREASED
WBC: 25.4 10*3/uL — ABNORMAL HIGH (ref 4.0–10.5)

## 2012-06-26 LAB — LEGIONELLA ANTIGEN, URINE: Legionella Antigen, Urine: NEGATIVE

## 2012-06-26 LAB — BASIC METABOLIC PANEL
BUN: 36 mg/dL — ABNORMAL HIGH (ref 6–23)
Calcium: 8.2 mg/dL — ABNORMAL LOW (ref 8.4–10.5)
Creatinine, Ser: 2.21 mg/dL — ABNORMAL HIGH (ref 0.50–1.35)
GFR calc Af Amer: 33 mL/min — ABNORMAL LOW (ref >90)
GFR calc non Af Amer: 29 mL/min — ABNORMAL LOW (ref >90)
Potassium: 4 mEq/L (ref 3.5–5.1)

## 2012-06-26 LAB — EXPECTORATED SPUTUM ASSESSMENT W GRAM STAIN, RFLX TO RESP C

## 2012-06-26 LAB — STREP PNEUMONIAE URINARY ANTIGEN: Strep Pneumo Urinary Antigen: NEGATIVE

## 2012-06-26 MED ORDER — DOXAZOSIN MESYLATE 2 MG PO TABS
2.0000 mg | ORAL_TABLET | Freq: Every morning | ORAL | Status: DC
  Administered 2012-06-26 – 2012-06-29 (×4): 2 mg via ORAL
  Filled 2012-06-26 (×6): qty 1

## 2012-06-26 MED ORDER — ASPIRIN 325 MG PO TABS
325.0000 mg | ORAL_TABLET | Freq: Every day | ORAL | Status: DC
  Administered 2012-06-26 – 2012-06-29 (×4): 325 mg via ORAL
  Filled 2012-06-26 (×5): qty 1

## 2012-06-26 MED ORDER — INSULIN ASPART 100 UNIT/ML ~~LOC~~ SOLN
0.0000 [IU] | Freq: Three times a day (TID) | SUBCUTANEOUS | Status: DC
  Administered 2012-06-26 (×3): 5 [IU] via SUBCUTANEOUS
  Administered 2012-06-28 – 2012-06-29 (×2): 3 [IU] via SUBCUTANEOUS
  Administered 2012-06-29: 5 [IU] via SUBCUTANEOUS
  Administered 2012-06-30: 3 [IU] via SUBCUTANEOUS

## 2012-06-26 MED ORDER — SODIUM CHLORIDE 0.9 % IV SOLN
INTRAVENOUS | Status: DC
  Administered 2012-06-26: 05:00:00 via INTRAVENOUS
  Administered 2012-06-26: 125 mL/h via INTRAVENOUS
  Administered 2012-06-27 – 2012-06-28 (×6): via INTRAVENOUS

## 2012-06-26 MED ORDER — DEXTROSE 5 % IV SOLN
1.0000 g | INTRAVENOUS | Status: DC
  Administered 2012-06-26 – 2012-06-29 (×4): 1 g via INTRAVENOUS
  Filled 2012-06-26 (×4): qty 10

## 2012-06-26 MED ORDER — INSULIN DETEMIR 100 UNIT/ML ~~LOC~~ SOLN
10.0000 [IU] | Freq: Every day | SUBCUTANEOUS | Status: DC
  Filled 2012-06-26: qty 0.1

## 2012-06-26 MED ORDER — SODIUM CHLORIDE 0.9 % IV SOLN: INTRAVENOUS | Status: DC

## 2012-06-26 MED ORDER — ACETAMINOPHEN 325 MG PO TABS
650.0000 mg | ORAL_TABLET | ORAL | Status: DC | PRN
  Administered 2012-06-26 – 2012-06-27 (×3): 650 mg via ORAL
  Filled 2012-06-26: qty 1
  Filled 2012-06-26 (×2): qty 2

## 2012-06-26 MED ORDER — INSULIN DETEMIR 100 UNIT/ML ~~LOC~~ SOLN
10.0000 [IU] | Freq: Every day | SUBCUTANEOUS | Status: DC
  Administered 2012-06-26 – 2012-06-29 (×4): 10 [IU] via SUBCUTANEOUS
  Filled 2012-06-26 (×4): qty 0.1

## 2012-06-26 MED ORDER — ACETAMINOPHEN 325 MG PO TABS
ORAL_TABLET | ORAL | Status: AC
  Administered 2012-06-26: 650 mg
  Filled 2012-06-26: qty 2

## 2012-06-26 MED ORDER — HEPARIN SODIUM (PORCINE) 5000 UNIT/ML IJ SOLN
5000.0000 [IU] | Freq: Three times a day (TID) | INTRAMUSCULAR | Status: DC
  Administered 2012-06-26 – 2012-06-30 (×13): 5000 [IU] via SUBCUTANEOUS
  Filled 2012-06-26 (×17): qty 1

## 2012-06-26 MED ORDER — AZITHROMYCIN 500 MG PO TABS
500.0000 mg | ORAL_TABLET | ORAL | Status: DC
  Administered 2012-06-26 – 2012-06-29 (×4): 500 mg via ORAL
  Filled 2012-06-26 (×5): qty 1

## 2012-06-26 MED ORDER — SIMVASTATIN 40 MG PO TABS
40.0000 mg | ORAL_TABLET | Freq: Every day | ORAL | Status: DC
  Administered 2012-06-26 – 2012-06-29 (×4): 40 mg via ORAL
  Filled 2012-06-26 (×6): qty 1

## 2012-06-26 NOTE — H&P (Signed)
[facility] History and Physical  [name]:[id number] DOB: 08-Mar-1946 DOA: 06/25/2012  Referring physician: ED PCP: Carollee Herter, MD  Specialists: None  Chief Complaint: AMS  HPI: [name] is a 67 y.o. male who had been feeling nausea and exhaustion x 2 days, came home early from work today because he wasn't feeling well and took a nap for several hours.  Upon waking at 6:45 pm he had immediate respiratory distress and was unable to sit up, he was initially unable to speak, disoriented to place, and vomited twice per the wife.  Wife called 911, thankfully his symptoms improved en-route to the hospital and by the time he was evaluated in the ED he was mentating at baseline.  Still, he was noted to be somewhat hypoxic, febrile, have a significant leukocytosis, AKI, and ultimately found to have a UTI.  There is suspicion that he may have CAP as well given that his presentation certianly clinically looks like CAP even if his CXR does not.  Hospitalist will admit the patient.  Review of Systems: 12 systems reviewed and otherwise negative.  Past Medical History  Diagnosis Date  . Arthritis   . Diabetes mellitus   . Allergy   . BPH (benign prostatic hyperplasia)   . Hypertension   . ED (erectile dysfunction)   . History of colonic polyps   . Dyslipidemia   . Obesity    Past Surgical History  Procedure Laterality Date  . Back surgery  2009    OLIN  . Revision total hip arthroplasty  11/07    RIGHT OLIN  . Colonoscopy  2006    HAYES  . Tonsillectomy      Pt was 67 yrs old  . Hernia repair  1996   Social History:  reports that he quit smoking about 26 years ago. He has never used smokeless tobacco. He reports that he does not drink alcohol or use illicit drugs.   Allergies  Allergen Reactions  . Sulfa Antibiotics   . Morphine And Related     History reviewed. No pertinent family history. No recent sick contacts.  Prior to Admission medications    Medication Sig Start Date End Date Taking? Authorizing Provider  aspirin 325 MG tablet Take 325 mg by mouth daily.     Yes Historical Provider, MD  cholecalciferol (VITAMIN D) 1000 UNITS tablet Take 1,000 Units by mouth daily.   Yes Historical Provider, MD  doxazosin (CARDURA) 2 MG tablet Take 2 mg by mouth every morning.   Yes Historical Provider, MD  glipiZIDE (GLUCOTROL XL) 5 MG 24 hr tablet Take 5 mg by mouth 2 (two) times daily.   Yes Historical Provider, MD  glucosamine-chondroitin 500-400 MG tablet Take 1 tablet by mouth 3 (three) times daily.     Yes Historical Provider, MD  insulin detemir (LEVEMIR) 100 UNIT/ML injection Inject 20 Units into the skin at bedtime.   Yes Historical Provider, MD  lisinopril-hydrochlorothiazide (PRINZIDE,ZESTORETIC) 20-12.5 MG per tablet Take 1 tablet by mouth every morning.   Yes Historical Provider, MD  metFORMIN (GLUCOPHAGE) 1000 MG tablet Take 1,000 mg by mouth 2 (two) times daily with a meal.   Yes Historical Provider, MD  Multiple Vitamins-Minerals (MULTIVITAMIN WITH MINERALS) tablet Take 1 tablet by mouth daily.     Yes Historical Provider, MD  simvastatin (ZOCOR) 40 MG tablet Take 1 tablet (40 mg total) by mouth at bedtime. 05/25/12  Yes Ronnald Nian, MD  sitaGLIPtin (JANUVIA) 100 MG tablet Take  100 mg by mouth every morning.  04/01/12  Yes Ronnald Nian, MD   Physical Exam: Filed Vitals:   06/25/12 2230 06/25/12 2245 06/25/12 2300 06/25/12 2315  BP:      Pulse: 89 87 85 91  Temp:      TempSrc:      Resp:      Height:      Weight:      SpO2: 94% 93% 94% 95%    General:  NAD, resting comfortably in bed Eyes: PEERLA EOMI ENT: mucous membranes moist Neck: supple w/o JVD Cardiovascular: RRR w/o MRG Respiratory: CTA B on my exam Abdomen: soft, nt, nd, bs+ Skin: no rash nor lesion Musculoskeletal: MAE, full ROM all 4 extremities Psychiatric: normal tone and affect Neurologic: AAOx3, grossly non-focal  Labs on Admission:  Basic Metabolic  Panel:  Recent Labs Lab 06/25/12 2105  NA 135  K 3.8  CL 99  CO2 23  GLUCOSE 291*  BUN 34*  CREATININE 2.30*  CALCIUM 8.6   Liver Function Tests:  Recent Labs Lab 06/25/12 2105  AST 24  ALT 22  ALKPHOS 79  BILITOT 1.3*  PROT 7.1  ALBUMIN 3.1*    Recent Labs Lab 06/25/12 2105  LIPASE 13   No results found for this basename: AMMONIA,  in the last 168 hours CBC:  Recent Labs Lab 06/25/12 2105  WBC 24.9*  NEUTROABS 23.5*  HGB 11.9*  HCT 34.9*  MCV 89.5  PLT 228   Cardiac Enzymes:  Recent Labs Lab 06/25/12 2105  TROPONINI <0.30    BNP (last 3 results) No results found for this basename: PROBNP,  in the last 8760 hours CBG: No results found for this basename: GLUCAP,  in the last 168 hours  Radiological Exams on Admission: Dg Chest 2 View  06/25/2012  *RADIOLOGY REPORT*  Clinical Data: Altered mental status.  Shortness of breath. Diabetes and hypertension.  CHEST - 2 VIEW  Comparison: 12/27/2008  Findings: Borderline heart size with normal pulmonary vascularity. Linear atelectasis or fibrosis in the left lung base appears similar to previous study, favor fibrosis.  Peribronchial thickening and interstitial changes suggest chronic bronchitis.  No focal consolidation.  No pneumothorax.  Mediastinal contours appear intact.  Calcification of the aorta.  Degenerative changes in the spine.  No significant change since previous study.  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  No evidence of active consolidation.   Original Report Authenticated By: Burman Nieves, M.D.    Ct Head Wo Contrast  06/25/2012  *RADIOLOGY REPORT*  Clinical Data: Altered mental status.  CT HEAD WITHOUT CONTRAST  Technique:  Contiguous axial images were obtained from the base of the skull through the vertex without contrast.  Comparison: None.  Findings: Mild diffuse cerebral atrophy.  Low attenuation changes in the deep white matter  consistent with small vessel ischemia. Ventricular dilatation consistent with central atrophy.  No mass effect or midline shift.  No abnormal extra-axial fluid collections.  Gray-white matter junctions are distinct.  Basal cisterns are not effaced.  No evidence of acute intracranial hemorrhage.  No depressed skull fractures.  There is opacification and hypoplasia of the right maxillary antrum with opacification of some of the ethmoid air cells bilaterally.  Mastoid air cells are not opacified.  Vascular calcifications.  IMPRESSION: No acute intracranial abnormalities.  Chronic atrophy and small vessel ischemic changes.  Opacification of right maxillary antrum and ethmoid air cells.   Original Report Authenticated  By: Burman Nieves, M.D.     EKG: Independently reviewed.  Assessment/Plan Principal Problem:   Sepsis secondary to UTI Active Problems:   Diabetes mellitus   Hypertension associated with diabetes   UTI (lower urinary tract infection)   CAP (community acquired pneumonia)   1. Sepsis secondary to UTI vs possible CAP - clinically could be CAP as no other obvious explanation for hypoxia, but CXR clear and no findings on physical exam, treating with rocephin and azithromycin empirically, on CAP pathway, urine, sputum, and blood cultures pending, thankfully patient appears to clinically have responded to treatment, continue fluids at 125 cc/hr especially as he has AKI. 2. AKI - likely pre-renal, fluids as above, will also check bladder scan and if positive have nursing put in foley catheter as he has a known history of BPH. 3. DM - will put patient on AC/HS CBG checks, half his home dose of levemir, hold home PO hypoglycemics and use med dose SSI for now. 4. HTN - continue home meds but holding lisinopril-hctz given AKI    Code Status: Full Code (must indicate code status--if unknown or must be presumed, indicate so) Family Communication: Spoke with wife at bedside (indicate person spoken  with, if applicable, with phone number if by telephone) Disposition Plan: Admit to inpatient (indicate anticipated LOS)  Time spent: 70 min  Obrien Huskins M. [facility] Pager [phone]  If 7PM-7AM, please contact night-coverage [email or web address] Password [email or web address] 06/26/2012, 1:05 AM

## 2012-06-26 NOTE — ED Provider Notes (Signed)
  Medical screening examination/treatment/procedure(s) were performed by non-physician practitioner and as supervising physician I was immediately available for consultation/collaboration.  On my exam the patient was in no distress.  On my exam he is sitting upright stating that he felt better.  The patient's evaluation is notable for acute kidney injury, though with his description of respiratory complaints, fever, he received with azithromycin and Rocephin prior to admission  I saw the ECG (if appropriate), relevant labs and studies - I agree with the interpretation.    Gerhard Munch, MD 06/26/12 2348

## 2012-06-26 NOTE — Progress Notes (Signed)
Pt seen and examined at bedside. Current blood work and test results reviewed. Pt reports feeling better this AM. He is maintaining oxygen saturation at target range. Continue ABX for treatment of PNA. CBC and BMP in AM. Debbora Presto, MD  [facility] Pager [phone]  If 7PM-7AM, please contact night-coverage [email or web address] Password [email or web address]

## 2012-06-27 ENCOUNTER — Inpatient Hospital Stay (HOSPITAL_COMMUNITY): Payer: Medicare HMO

## 2012-06-27 DIAGNOSIS — J189 Pneumonia, unspecified organism: Secondary | ICD-10-CM

## 2012-06-27 LAB — GLUCOSE, CAPILLARY: Glucose-Capillary: 101 mg/dL — ABNORMAL HIGH (ref 70–99)

## 2012-06-27 LAB — CBC
MCH: 30.3 pg (ref 26.0–34.0)
MCHC: 34 g/dL (ref 30.0–36.0)
MCV: 89.1 fL (ref 78.0–100.0)
Platelets: 181 10*3/uL (ref 150–400)
RDW: 13.6 % (ref 11.5–15.5)

## 2012-06-27 LAB — EXPECTORATED SPUTUM ASSESSMENT W GRAM STAIN, RFLX TO RESP C

## 2012-06-27 LAB — BASIC METABOLIC PANEL
Calcium: 8.3 mg/dL — ABNORMAL LOW (ref 8.4–10.5)
Creatinine, Ser: 2.12 mg/dL — ABNORMAL HIGH (ref 0.50–1.35)
GFR calc non Af Amer: 30 mL/min — ABNORMAL LOW (ref >90)
Sodium: 133 mEq/L — ABNORMAL LOW (ref 135–145)

## 2012-06-27 MED ORDER — POTASSIUM CHLORIDE CRYS ER 20 MEQ PO TBCR
40.0000 meq | EXTENDED_RELEASE_TABLET | Freq: Once | ORAL | Status: AC
  Administered 2012-06-27: 40 meq via ORAL
  Filled 2012-06-27: qty 2

## 2012-06-27 MED ORDER — ALBUTEROL SULFATE (5 MG/ML) 0.5% IN NEBU: 2.5000 mg | INHALATION_SOLUTION | RESPIRATORY_TRACT | Status: DC | PRN

## 2012-06-27 MED ORDER — OXYCODONE-ACETAMINOPHEN 5-325 MG PO TABS: 1.0000 | ORAL_TABLET | ORAL | Status: DC | PRN

## 2012-06-27 NOTE — Progress Notes (Signed)
Patient ID: [name], male   DOB: [birth year], 67 y.o.   MRN: [id number]  [facility] PROGRESS NOTE  [name] NWG:[id number] DOB: 04-30-1945 DOA: 06/25/2012 PCP: Carollee Herter, MD  Brief narrative: Pt is 67 y.o. male who presented to [facility] ED with main concern of progressively worsening exertional shortness of breath associated with productive cough of yellow sputum, dyspnea progressed to rest as well with development of subjective fevers, chills, generalized weakness. Per wife, pt became more confused and was unable to sit up and was gasping for air.  In ED, pt found to be hypoxic with oxygen saturations in 80's wuickly improved on oxygen via [place], febrile with T max 103F, acute renal failure, ? CAP. [facility] asked to admit for further evaluation.   Principal Problem:   Sepsis secondary to UTI and PNA (CAP) - now clinically improving, T max 101.14F, supportive care with ABX - follow upon urine culture, blood cultures, CT chest   Acute respiratory failure - with hypoxia and findings suggestive of PNA even though not clear on CXR - will obtain CT chest, continue Zithromax and Rocephin - follow up on sputum culture, urine legionella and strep pneumo  Active Problems:   Leukocytosis - likely secondary to UTI and PNA - continue ABX as noted above - CBC in AM    Anemia of chronic disease  - slight drop in Hg since admission and can be dilutional as pt is on IVF - CBC in AM   Acute renal failure  - likely of pre renal etiology as pt has no known history of kidney problems - last BUN and Creatinine 04/2102 within normal limits  - monitor I's and O's, may need renal US, pt maintaining good urine output - continue IVF   Hypokalemia  - mild, will supplement today, BMP in AM   Diabetes mellitus with complications of HTN - last A1C 04/2012 was 6.5   Hypertension  - reasonable inpatient control    UTI (lower urinary tract infection) - follow upon urine culture, continue  current ABX    CAP (community acquired pneumonia) - not clearly evident on CXR but sympotms suggestive of it and pt now clinically responding to ABX - sputum analysis pending - continue current ABX and follow upon CT chest   Consultants:  None  Procedures/Studies:  Dg Chest 2 View 06/25/2012  Chronic bronchitic changes in the lungs with probable fibrosis in the left lung base.  No evidence of active consolidation.    Ct Head Wo Contrast 06/25/2012 No acute intracranial abnormalities. Chronic atrophy, small vessel ischemic changes. Opacification of right maxillary antrum and ethmoid air cells.     Antibiotics:  Rocephin 04/06 -->  Zithromax 04/06 -->  Code Status: Full Family Communication: Pt at bedside Disposition Plan: Home when medically stable  HPI/Subjective: No events overnight.   Objective: Filed Vitals:   06/26/12 1301 06/26/12 2134 06/27/12 0633 06/27/12 1352  BP: 130/59 140/75 134/60 176/72  Pulse: 74 95 93 91  Temp: 97.8 F (36.6 C) 101.9 F (38.8 C) 99.6 F (37.6 C) 98.4 F (36.9 C)  TempSrc: Oral Oral Oral Oral  Resp: 16 20 18 19   Height:      Weight:      SpO2: 99% 96% 98% 98%    Intake/Output Summary (Last 24 hours) at 06/27/12 1914 Last data filed at 06/27/12 1858  Gross per 24 hour  Intake   3050 ml  Output   2225 ml  Net    825  ml    Exam:   General:  Pt is alert, follows commands appropriately, not in acute distress  Cardiovascular: Regular rate and rhythm, S1/S2, no murmurs, no rubs, no gallops  Respiratory: Clear to auscultation bilaterally, no wheezing, no crackles, no rhonchi  Abdomen: Soft, non tender, non distended, bowel sounds present, no guarding  Extremities: No edema, pulses DP and PT palpable bilaterally  Neuro: Grossly nonfocal  Data Reviewed: Basic Metabolic Panel:  Recent Labs Lab 06/25/12 2105 06/26/12 0529 06/27/12 0430  NA 135 132* 133*  K 3.8 4.0 3.4*  CL 99 98 101  CO2 23 21 23   GLUCOSE 291* 297* 102*   BUN 34* 36* 34*  CREATININE 2.30* 2.21* 2.12*  CALCIUM 8.6 8.2* 8.3*   Liver Function Tests:  Recent Labs Lab 06/25/12 2105  AST 24  ALT 22  ALKPHOS 79  BILITOT 1.3*  PROT 7.1  ALBUMIN 3.1*    Recent Labs Lab 06/25/12 2105  LIPASE 13   CBC:  Recent Labs Lab 06/25/12 2105 06/26/12 0529 06/27/12 0430  WBC 24.9* 25.4* 18.6*  NEUTROABS 23.5* 23.1*  --   HGB 11.9* 10.9* 10.8*  HCT 34.9* 32.4* 31.8*  MCV 89.5 90.3 89.1  PLT 228 184 181   Cardiac Enzymes:  Recent Labs Lab 06/25/12 2105  TROPONINI <0.30   CBG:  Recent Labs Lab 06/26/12 1712 06/26/12 2138 06/27/12 0751 06/27/12 1210 06/27/12 1733  GLUCAP 211* 168* 101* 109* 91    Recent Results (from the past 240 hour(s))  URINE CULTURE     Status: None   Collection Time    06/25/12 10:25 PM      Result Value Range Status   Specimen Description URINE, CLEAN CATCH   Final   Special Requests NONE   Final   Culture  Setup Time 06/26/2012 03:34   Final   Colony Count >=100,000 COLONIES/ML   Final   Culture ESCHERICHIA COLI   Final   Report Status PENDING   Incomplete  CULTURE, BLOOD (ROUTINE X 2)     Status: None   Collection Time    06/26/12  5:23 AM      Result Value Range Status   Specimen Description BLOOD LEFT HAND   Final   Special Requests BOTTLES DRAWN AEROBIC ONLY 3CC   Final   Culture  Setup Time 06/26/2012 13:44   Final   Culture     Final   Value:        BLOOD CULTURE RECEIVED NO GROWTH TO DATE CULTURE WILL BE HELD FOR 5 DAYS BEFORE ISSUING A FINAL NEGATIVE REPORT   Report Status PENDING   Incomplete  CULTURE, BLOOD (ROUTINE X 2)     Status: None   Collection Time    06/26/12  5:23 AM      Result Value Range Status   Specimen Description BLOOD RIGHT HAND   Final   Special Requests BOTTLES DRAWN AEROBIC ONLY 10CC   Final   Culture  Setup Time 06/26/2012 13:44   Final   Culture     Final   Value:        BLOOD CULTURE RECEIVED NO GROWTH TO DATE CULTURE WILL BE HELD FOR 5 DAYS BEFORE  ISSUING A FINAL NEGATIVE REPORT   Report Status PENDING   Incomplete  CULTURE, EXPECTORATED SPUTUM-ASSESSMENT     Status: None   Collection Time    06/26/12  7:32 AM      Result Value Range Status   Specimen Description SPUTUM  Final   Special Requests NONE   Final   Sputum evaluation     Final   Value: MICROSCOPIC FINDINGS SUGGEST THAT THIS SPECIMEN IS NOT REPRESENTATIVE OF LOWER RESPIRATORY SECRETIONS. PLEASE RECOLLECT.     NOTIFIED HODGES,A AT 0842 ON [id number] BY POTEAT,S   Report Status 06/26/2012 FINAL   Final  CULTURE, EXPECTORATED SPUTUM-ASSESSMENT     Status: None   Collection Time    06/27/12  8:15 AM      Result Value Range Status   Specimen Description SPUTUM   Final   Special Requests Normal   Final   Sputum evaluation     Final   Value: THIS SPECIMEN IS ACCEPTABLE. RESPIRATORY CULTURE REPORT TO FOLLOW.   Report Status 06/27/2012 FINAL   Final     Scheduled Meds: . aspirin  325 mg Oral Daily  . azithromycin  500 mg Oral Q24H  . cefTRIAXone (ROCEPHIN)  IV  1 g Intravenous Q24H  . doxazosin  2 mg Oral q morning - 10a  . heparin  5,000 Units Subcutaneous Q8H  . insulin aspart  0-15 Units Subcutaneous TID WC  . insulin detemir  10 Units Subcutaneous QHS  . simvastatin  40 mg Oral QHS   Continuous Infusions: . sodium chloride 125 mL/hr at 06/27/12 1125    Debbora Presto, MD  [facility] Pager [phone]  If 7PM-7AM, please contact night-coverage [email or web address] Password [email or web address] 06/27/2012, 7:14 PM   LOS: 2 days

## 2012-06-27 NOTE — Progress Notes (Signed)
   CARE MANAGEMENT NOTE 06/27/2012  Patient:  SHAWNTA, [name]   Account Number:  [id number]  Date Initiated:  06/27/2012  Documentation initiated by:  Jiles Crocker  Subjective/Objective Assessment:   ADMITTED WITH SEPSIS, UTI     Action/Plan:   PCP: Carollee Herter, MD  INDEPENDENT PRIOR TO ADMISSION, CONTINUES TO WORK; CM WILL FOLLOW FOR DCP   Anticipated DC Date:  06/30/2012   Anticipated DC Plan:  HOME/SELF CARE      DC Planning Services  CM consult          Status of service:  In process, will continue to follow Medicare Important Message given?  NA - LOS <3 / Initial given by admissions (If response is "NO", the following Medicare IM given date fields will be blank) Per UR Regulation:  Reviewed for med. necessity/level of care/duration of stay Comments:  06/27/2012- B Carmela Piechowski RN,BSN,MHA

## 2012-06-28 ENCOUNTER — Inpatient Hospital Stay (HOSPITAL_COMMUNITY): Payer: Medicare HMO

## 2012-06-28 LAB — BASIC METABOLIC PANEL
BUN: 33 mg/dL — ABNORMAL HIGH (ref 6–23)
Chloride: 102 mEq/L (ref 96–112)
Creatinine, Ser: 2.06 mg/dL — ABNORMAL HIGH (ref 0.50–1.35)
Glucose, Bld: 108 mg/dL — ABNORMAL HIGH (ref 70–99)
Potassium: 3.7 mEq/L (ref 3.5–5.1)

## 2012-06-28 LAB — URINE CULTURE

## 2012-06-28 LAB — CBC
HCT: 31 % — ABNORMAL LOW (ref 39.0–52.0)
Hemoglobin: 10.9 g/dL — ABNORMAL LOW (ref 13.0–17.0)
MCV: 88.1 fL (ref 78.0–100.0)
WBC: 16.2 10*3/uL — ABNORMAL HIGH (ref 4.0–10.5)

## 2012-06-28 LAB — GLUCOSE, CAPILLARY
Glucose-Capillary: 120 mg/dL — ABNORMAL HIGH (ref 70–99)
Glucose-Capillary: 177 mg/dL — ABNORMAL HIGH (ref 70–99)

## 2012-06-28 NOTE — Progress Notes (Signed)
Patient ID: [name], male   DOB: January 14, 1946, 67 y.o.   MRN: [id number]  [facility] PROGRESS NOTE  RAYN ENDERSON GNF:[id number] DOB: 04-29-1945 DOA: 06/25/2012 PCP: Carollee Herter, MD  Brief narrative:  Pt is 67 y.o. male who presented to [facility] ED with main concern of progressively worsening exertional shortness of breath associated with productive cough of yellow sputum, dyspnea progressed to rest as well with development of subjective fevers, chills, generalized weakness. Per wife, pt became more confused and was unable to sit up and was gasping for air.  In ED, pt found to be hypoxic with oxygen saturations in 80's wuickly improved on oxygen via [place], febrile with T max 103F, acute renal failure, ? CAP. [facility] asked to admit for further evaluation.   Principal Problem:  Sepsis secondary to UTI and ? PNA (CAP)  - now clinically improving, afebrile over 24 hours and now clinically at his baseline  - CT chest unremarkable for PNA or fibrosis as suggested by CXR - will continue ABX noted below as pt is improving and cough is also better, less sputum  - Urine cultures positive for E. Coli, sensitivity report pending  Acute respiratory failure  - with hypoxia and findings suggestive of PNA even though not clear on CXR - CT chest also unremarkable for acute pulmonary findings to suggest etiology of hypoxic respiratory failure, continue Zithromax and Rocephin  - follow up on sputum culture - urine legionella and strep pneumo negative  Active Problems:  Leukocytosis  - likely secondary to UTI and ? PNA, WBC continuing to trend down   - continue ABX as noted above  - CBC in AM  Anemia of chronic disease  - Hg and Hct remain stable over 24 hours - CBC in AM  Acute renal failure  - likely of pre renal etiology as pt has no known history of kidney problems  - last BUN and Creatinine 04/2102 within normal limits  - monitor I's and O's, renal US unremarkable for acute event and no  hydronephrosis - bladder scan ordered as pt reports feeling of incomplete voiding  Hypokalemia  - supplemented, BMP in AM  Diabetes mellitus with complications of HTN  - last A1C 04/2012 was 6.5  Hypertension  - if the BP remains elevated, consider adding Norvasc  UTI (lower urinary tract infection)  - urine culture positive for E. Coli, final sensitivity report pending, continue current ABX  CAP (community acquired pneumonia)  - not clearly evident on CXR but sympotms suggestive of it and pt now clinically responding to ABX  - sputum analysis unremarkable to date  - CT chest also unremarkable   Consultants:  Spoke with urologist on call due to pt's concern of enlarged prostate, suggestion was to obtain bladder scan and renal US, monitor urine output and renal function tests  Procedures/Studies:  Dg Chest 2 View 06/25/2012 Chronic bronchitic changes in the lungs with probable fibrosis in the left lung base. No evidence of active consolidation.  Ct Head Wo Contrast 06/25/2012 No acute intracranial abnormalities. Chronic atrophy, small vessel ischemic changes. Opacification of right maxillary antrum and ethmoid air cells.  Antibiotics:  Rocephin 04/06 -->  Zithromax 04/06 -->  Code Status: Full  Family Communication: Pt and wife at bedside  Disposition Plan: Home when medically stable    HPI/Subjective: No events overnight.   Objective: Filed Vitals:   06/27/12 1352 06/27/12 2104 06/28/12 0322 06/28/12 0603  BP: 176/72 154/64  151/76  Pulse: 91 151  86  Temp: 98.4 F (36.9 C) 98.8 F (37.1 C) 98.5 F (36.9 C) 97.7 F (36.5 C)  TempSrc: Oral Oral Oral Oral  Resp: 19 20  20   Height:      Weight:      SpO2: 98% 98%  98%    Intake/Output Summary (Last 24 hours) at 06/28/12 1349 Last data filed at 06/28/12 0700  Gross per 24 hour  Intake 2112.5 ml  Output   2450 ml  Net -337.5 ml    Exam:   General:  Pt is alert, follows commands appropriately, not in acute  distress  Cardiovascular: Regular rate and rhythm, S1/S2, no murmurs, no rubs, no gallops  Respiratory: Clear to auscultation bilaterally, no wheezing, no crackles, no rhonchi, decreased breath sounds at bases   Abdomen: Soft, non tender, non distended, bowel sounds present, no guarding  Extremities: No edema, pulses DP and PT palpable bilaterally  Neuro: Grossly nonfocal  Data Reviewed: Basic Metabolic Panel:  Recent Labs Lab 06/25/12 2105 06/26/12 0529 06/27/12 0430 06/28/12 0425  NA 135 132* 133* 132*  K 3.8 4.0 3.4* 3.7  CL 99 98 101 102  CO2 23 21 23 21   GLUCOSE 291* 297* 102* 108*  BUN 34* 36* 34* 33*  CREATININE 2.30* 2.21* 2.12* 2.06*  CALCIUM 8.6 8.2* 8.3* 8.4   Liver Function Tests:  Recent Labs Lab 06/25/12 2105  AST 24  ALT 22  ALKPHOS 79  BILITOT 1.3*  PROT 7.1  ALBUMIN 3.1*    Recent Labs Lab 06/25/12 2105  LIPASE 13   CBC:  Recent Labs Lab 06/25/12 2105 06/26/12 0529 06/27/12 0430 06/28/12 0425  WBC 24.9* 25.4* 18.6* 16.2*  NEUTROABS 23.5* 23.1*  --   --   HGB 11.9* 10.9* 10.8* 10.9*  HCT 34.9* 32.4* 31.8* 31.0*  MCV 89.5 90.3 89.1 88.1  PLT 228 184 181 189   Cardiac Enzymes:  Recent Labs Lab 06/25/12 2105  TROPONINI <0.30   CBG:  Recent Labs Lab 06/27/12 1210 06/27/12 1733 06/27/12 2104 06/28/12 0751 06/28/12 1205  GLUCAP 109* 91 114* 100* 120*    Recent Results (from the past 240 hour(s))  URINE CULTURE     Status: None   Collection Time    06/25/12 10:25 PM      Result Value Range Status   Specimen Description URINE, CLEAN CATCH   Final   Special Requests NONE   Final   Culture  Setup Time 06/26/2012 03:34   Final   Colony Count >=100,000 COLONIES/ML   Final   Culture ESCHERICHIA COLI   Final   Report Status 06/28/2012 FINAL   Final   Organism ID, Bacteria ESCHERICHIA COLI   Final  CULTURE, BLOOD (ROUTINE X 2)     Status: None   Collection Time    06/26/12  5:23 AM      Result Value Range Status    Specimen Description BLOOD LEFT HAND   Final   Special Requests BOTTLES DRAWN AEROBIC ONLY 3CC   Final   Culture  Setup Time 06/26/2012 13:44   Final   Culture     Final   Value:        BLOOD CULTURE RECEIVED NO GROWTH TO DATE CULTURE WILL BE HELD FOR 5 DAYS BEFORE ISSUING A FINAL NEGATIVE REPORT   Report Status PENDING   Incomplete  CULTURE, BLOOD (ROUTINE X 2)     Status: None   Collection Time    06/26/12  5:23 AM  Result Value Range Status   Specimen Description BLOOD RIGHT HAND   Final   Special Requests BOTTLES DRAWN AEROBIC ONLY 10CC   Final   Culture  Setup Time 06/26/2012 13:44   Final   Culture     Final   Value:        BLOOD CULTURE RECEIVED NO GROWTH TO DATE CULTURE WILL BE HELD FOR 5 DAYS BEFORE ISSUING A FINAL NEGATIVE REPORT   Report Status PENDING   Incomplete  CULTURE, EXPECTORATED SPUTUM-ASSESSMENT     Status: None   Collection Time    06/26/12  7:32 AM      Result Value Range Status   Specimen Description SPUTUM   Final   Special Requests NONE   Final   Sputum evaluation     Final   Value: MICROSCOPIC FINDINGS SUGGEST THAT THIS SPECIMEN IS NOT REPRESENTATIVE OF LOWER RESPIRATORY SECRETIONS. PLEASE RECOLLECT.     NOTIFIED HODGES,A AT 0842 ON [id number] BY POTEAT,S   Report Status 06/26/2012 FINAL   Final  CULTURE, EXPECTORATED SPUTUM-ASSESSMENT     Status: None   Collection Time    06/27/12  8:15 AM      Result Value Range Status   Specimen Description SPUTUM   Final   Special Requests Normal   Final   Sputum evaluation     Final   Value: THIS SPECIMEN IS ACCEPTABLE. RESPIRATORY CULTURE REPORT TO FOLLOW.   Report Status 06/27/2012 FINAL   Final  CULTURE, RESPIRATORY (NON-EXPECTORATED)     Status: None   Collection Time    06/27/12  9:06 AM      Result Value Range Status   Specimen Description SPUTUM   Final   Special Requests NONE   Final   Gram Stain     Final   Value: RARE WBC PRESENT, PREDOMINANTLY PMN     FEW SQUAMOUS EPITHELIAL CELLS PRESENT     RARE  GRAM POSITIVE COCCI     IN PAIRS   Culture Culture reincubated for better growth   Final   Report Status PENDING   Incomplete     Scheduled Meds: . aspirin  325 mg Oral Daily  . azithromycin  500 mg Oral Q24H  . cefTRIAXone   IV  1 g Intravenous Q24H  . doxazosin  2 mg Oral q morning - 10a  . heparin  5,000 Units Subcutaneous Q8H  . insulin aspart  0-15 Units Subcutaneous TID WC  . insulin detemir  10 Units Subcutaneous QHS  . simvastatin  40 mg Oral QHS   Continuous Infusions: . sodium chloride 125 mL/hr at 06/28/12 1347   Debbora Presto, MD  [facility] Pager [phone]  If 7PM-7AM, please contact night-coverage [email or web address] Password [email or web address] 06/28/2012, 1:49 PM   LOS: 3 days

## 2012-06-29 LAB — GLUCOSE, CAPILLARY
Glucose-Capillary: 115 mg/dL — ABNORMAL HIGH (ref 70–99)
Glucose-Capillary: 170 mg/dL — ABNORMAL HIGH (ref 70–99)
Glucose-Capillary: 235 mg/dL — ABNORMAL HIGH (ref 70–99)
Glucose-Capillary: 188 mg/dL — ABNORMAL HIGH (ref 70–99)

## 2012-06-29 LAB — CULTURE, RESPIRATORY W GRAM STAIN: Culture: NORMAL

## 2012-06-29 LAB — CBC
HCT: 30.3 % — ABNORMAL LOW (ref 39.0–52.0)
Hemoglobin: 10.5 g/dL — ABNORMAL LOW (ref 13.0–17.0)
RBC: 3.44 MIL/uL — ABNORMAL LOW (ref 4.22–5.81)
WBC: 12.9 10*3/uL — ABNORMAL HIGH (ref 4.0–10.5)

## 2012-06-29 LAB — BASIC METABOLIC PANEL
CO2: 24 mEq/L (ref 19–32)
Chloride: 103 mEq/L (ref 96–112)
GFR calc non Af Amer: 37 mL/min — ABNORMAL LOW (ref >90)
Glucose, Bld: 117 mg/dL — ABNORMAL HIGH (ref 70–99)
Potassium: 3.6 mEq/L (ref 3.5–5.1)
Sodium: 135 mEq/L (ref 135–145)

## 2012-06-29 NOTE — Progress Notes (Signed)
[facility] PROGRESS NOTE  BASHEER MOLCHAN ZOX:[id number] DOB: Dec 04, 1945 DOA: 06/25/2012 PCP: Carollee Herter, MD  Brief narrative: [name] is an 67 y.o. male was admitted on 06/26/2012 with toxic encephalopathy related to sepsis from a urinary tract infection. There was also some concern for pneumonia secondary to cough, dyspnea, fever and chills.  Assessment/Plan: Principal problem:  Sepsis secondary to UTI and ? PNA (CAP)  - CT chest unremarkable for PNA or fibrosis as suggested by CXR. - Continue ABX, no convincing clinical evidence of pneumonia. - Urine cultures positive for E. Coli, Rocephin sensitive. Active problems:  Acute respiratory failure  -With associated hypoxia and findings suggestive of PNA even though not clear on CXR. - CT chest also unremarkable for acute pulmonary findings to suggest etiology of hypoxic respiratory failure, continue Zithromax and Rocephin  -Sputum culture sent but secretions are consistent with lower respiratory samples. -Urinary legionella and strep pneumo negative.  Anemia of chronic disease  -Hemoglobin stable with no indication for transfusion. Acute renal failure  -Likely from sepsis and prerenal etiologies. -Last BUN and Creatinine 04/2102 within normal limits. -Renal US unremarkable for acute event and no hydronephrosis.  -Bladder scan per nursing staff with less than 130 cc of residual. -Recurrent problems with UTIs, do recommend outpatient followup with urology. Hypokalemia  -Repleted. Diabetes mellitus with complications of HTN  -Last A1C 04/2012 was 6.5 percent. -CBGs 115-170. Continue moderate scale as a side. Hypertension  -Blood pressure elevated. HCTZ/lisinopril on hold secondary to acute kidney injury. UTI (lower urinary tract infection)  -Continue Rocephin.  Code Status: Full  Family Communication: Pt and wife at bedside  Disposition Plan: Home when medically stable    Medical  Consultants:  None.  Other Consultants:  None.  Anti-infectives: Rocephin 04/06 -->  Zithromax 04/06 -->   HPI/Subjective: [name] is feeling much better. He denies dyspnea or cough. No nausea or vomiting. No significant pain.  Objective: Filed Vitals:   06/28/12 1430 06/28/12 2111 06/29/12 0603 06/29/12 0701  BP: 155/71 143/78 167/74 160/74  Pulse: 90 73 78   Temp: 98.5 F (36.9 C) 98.8 F (37.1 C) 98.2 F (36.8 C)   TempSrc: Oral Oral Oral   Resp: 18 20 20    Height:      Weight:      SpO2: 100% 98% 98%     Intake/Output Summary (Last 24 hours) at 06/29/12 1323 Last data filed at 06/29/12 1257  Gross per 24 hour  Intake 2412.5 ml  Output   1200 ml  Net 1212.5 ml    Exam: Gen:  NAD Cardiovascular:  RRR, No M/R/G Respiratory:  Lungs CTAB Gastrointestinal:  Abdomen soft, NT/ND, + BS Extremities:  No C/E/C  Data Reviewed: Basic Metabolic Panel:  Recent Labs Lab 06/25/12 2105 06/26/12 0529 06/27/12 0430 06/28/12 0425 06/29/12 0425  NA 135 132* 133* 132* 135  K 3.8 4.0 3.4* 3.7 3.6  CL 99 98 101 102 103  CO2 23 21 23 21 24   GLUCOSE 291* 297* 102* 108* 117*  BUN 34* 36* 34* 33* 30*  CREATININE 2.30* 2.21* 2.12* 2.06* 1.80*  CALCIUM 8.6 8.2* 8.3* 8.4 8.6   GFR Estimated Creatinine Clearance: 46.4 ml/min (by C-G formula based on Cr of 1.8). Liver Function Tests:  Recent Labs Lab 06/25/12 2105  AST 24  ALT 22  ALKPHOS 79  BILITOT 1.3*  PROT 7.1  ALBUMIN 3.1*    Recent Labs Lab 06/25/12 2105  LIPASE 13   Coagulation profile  Recent Labs Lab 06/25/12 2105  INR 1.13    CBC:  Recent Labs Lab 06/25/12 2105 06/26/12 0529 06/27/12 0430 06/28/12 0425 06/29/12 0425  WBC 24.9* 25.4* 18.6* 16.2* 12.9*  NEUTROABS 23.5* 23.1*  --   --   --   HGB 11.9* 10.9* 10.8* 10.9* 10.5*  HCT 34.9* 32.4* 31.8* 31.0* 30.3*  MCV 89.5 90.3 89.1 88.1 88.1  PLT 228 184 181 189 218   Cardiac Enzymes:  Recent Labs Lab 06/25/12 2105   TROPONINI <0.30   CBG:  Recent Labs Lab 06/28/12 1205 06/28/12 1712 06/28/12 2114 06/29/12 0740 06/29/12 1154  GLUCAP 120* 177* 157* 115* 170*   Microbiology Recent Results (from the past 240 hour(s))  URINE CULTURE     Status: None   Collection Time    06/25/12 10:25 PM      Result Value Range Status   Specimen Description URINE, CLEAN CATCH   Final   Special Requests NONE   Final   Culture  Setup Time 06/26/2012 03:34   Final   Colony Count >=100,000 COLONIES/ML   Final   Culture ESCHERICHIA COLI   Final   Report Status 06/28/2012 FINAL   Final   Organism ID, Bacteria ESCHERICHIA COLI   Final  CULTURE, BLOOD (ROUTINE X 2)     Status: None   Collection Time    06/26/12  5:23 AM      Result Value Range Status   Specimen Description BLOOD LEFT HAND   Final   Special Requests BOTTLES DRAWN AEROBIC ONLY 3CC   Final   Culture  Setup Time 06/26/2012 13:44   Final   Culture     Final   Value:        BLOOD CULTURE RECEIVED NO GROWTH TO DATE CULTURE WILL BE HELD FOR 5 DAYS BEFORE ISSUING A FINAL NEGATIVE REPORT   Report Status PENDING   Incomplete  CULTURE, BLOOD (ROUTINE X 2)     Status: None   Collection Time    06/26/12  5:23 AM      Result Value Range Status   Specimen Description BLOOD RIGHT HAND   Final   Special Requests BOTTLES DRAWN AEROBIC ONLY 10CC   Final   Culture  Setup Time 06/26/2012 13:44   Final   Culture     Final   Value:        BLOOD CULTURE RECEIVED NO GROWTH TO DATE CULTURE WILL BE HELD FOR 5 DAYS BEFORE ISSUING A FINAL NEGATIVE REPORT   Report Status PENDING   Incomplete  CULTURE, EXPECTORATED SPUTUM-ASSESSMENT     Status: None   Collection Time    06/26/12  7:32 AM      Result Value Range Status   Specimen Description SPUTUM   Final   Special Requests NONE   Final   Sputum evaluation     Final   Value: MICROSCOPIC FINDINGS SUGGEST THAT THIS SPECIMEN IS NOT REPRESENTATIVE OF LOWER RESPIRATORY SECRETIONS. PLEASE RECOLLECT.     NOTIFIED HODGES,A  AT [id number] ON [id number] BY POTEAT,S   Report Status 06/26/2012 FINAL   Final  CULTURE, EXPECTORATED SPUTUM-ASSESSMENT     Status: None   Collection Time    06/27/12  8:15 AM      Result Value Range Status   Specimen Description SPUTUM   Final   Special Requests Normal   Final   Sputum evaluation     Final   Value: THIS SPECIMEN IS ACCEPTABLE. RESPIRATORY CULTURE REPORT TO FOLLOW.  Report Status 06/27/2012 FINAL   Final  CULTURE, RESPIRATORY (NON-EXPECTORATED)     Status: None   Collection Time    06/27/12  9:06 AM      Result Value Range Status   Specimen Description SPUTUM   Final   Special Requests NONE   Final   Gram Stain     Final   Value: RARE WBC PRESENT, PREDOMINANTLY PMN     FEW SQUAMOUS EPITHELIAL CELLS PRESENT     RARE GRAM POSITIVE COCCI     IN PAIRS   Culture NORMAL OROPHARYNGEAL FLORA   Final   Report Status 06/29/2012 FINAL   Final     Procedures and Diagnostic Studies: Dg Chest 2 View  06/25/2012  *RADIOLOGY REPORT*  Clinical Data: Altered mental status.  Shortness of breath. Diabetes and hypertension.  CHEST - 2 VIEW  Comparison: 12/27/2008  Findings: Borderline heart size with normal pulmonary vascularity. Linear atelectasis or fibrosis in the left lung base appears similar to previous study, favor fibrosis.  Peribronchial thickening and interstitial changes suggest chronic bronchitis.  No focal consolidation.  No pneumothorax.  Mediastinal contours appear intact.  Calcification of the aorta.  Degenerative changes in the spine.  No significant change since previous study.  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  No evidence of active consolidation.   Original Report Authenticated By: Burman Nieves, M.D.    Ct Head Wo Contrast  06/25/2012  *RADIOLOGY REPORT*  Clinical Data: Altered mental status.  CT HEAD WITHOUT CONTRAST  Technique:  Contiguous axial images were obtained from the base of the skull through the  vertex without contrast.  Comparison: None.  Findings: Mild diffuse cerebral atrophy.  Low attenuation changes in the deep white matter consistent with small vessel ischemia. Ventricular dilatation consistent with central atrophy.  No mass effect or midline shift.  No abnormal extra-axial fluid collections.  Gray-white matter junctions are distinct.  Basal cisterns are not effaced.  No evidence of acute intracranial hemorrhage.  No depressed skull fractures.  There is opacification and hypoplasia of the right maxillary antrum with opacification of some of the ethmoid air cells bilaterally.  Mastoid air cells are not opacified.  Vascular calcifications.  IMPRESSION: No acute intracranial abnormalities.  Chronic atrophy and small vessel ischemic changes.  Opacification of right maxillary antrum and ethmoid air cells.   Original Report Authenticated By: Burman Nieves, M.D.    Ct Chest Wo Contrast  06/27/2012  *RADIOLOGY REPORT*  Clinical Data: Left lung fibrosis.  Pneumonia.  CT CHEST WITHOUT CONTRAST  Technique:  Multidetector CT imaging of the chest was performed following the standard protocol without IV contrast.  Comparison: Two-view chest 06/25/2012.  Findings: The heart size is normal.  There is mild prominence of the pulmonary arteries bilaterally.  No significant mediastinal or axillary adenopathy is present.  There is some heterogeneity of the thyroid without a discrete lesion.  Limited imaging of the upper abdomen demonstrates some stranding about the kidneys bilaterally without any discrete lesions.  Atherosclerotic calcifications are noted in the thoracic and upper abdominal aorta without aneurysm. Advanced degenerative changes are noted at the first sternoclavicular joints.  Mild centrilobular emphysematous changes are present.  A peripheral micronodular pattern is present in the left upper lobe which may be post infectious.  No discrete airspace consolidation is evident. No significant fibrotic  changes are present.  The bone windows demonstrates multilevel endplate degenerative change.  S-shaped curvature is present in the upper  thoracic spine. The focal sclerotic lesion in the posterior left fifth rib is nonspecific.  No other focal lytic or blastic lesions are present.  IMPRESSION:  1.  No significant pneumonia or fibrosis. 2.  Micronodular pattern in the peripheral left upper lobe may be related to prior or atypical infection. 3.  Well-defined sclerotic lesion of the posterolateral left 5th rib measures 12 mm in maximal dimension.  No other focal lytic or blastic lesions are present. This is well circumscribed and likely represents a bone island without other evidence for metastatic disease. 4.  Degenerative endplate changes and scoliosis.   Original Report Authenticated By: Marin Roberts, M.D.    US Renal  06/28/2012  *RADIOLOGY REPORT*  Clinical Data: Diabetes mellitus and hypertension. Acute renal failure.  RENAL/URINARY TRACT ULTRASOUND COMPLETE  Comparison:  None  Findings:  Right Kidney:  The right kidney measures 12.2 cm in sagittal length.  Echogenicity and thickness of the renal cortex appear within normal limits.  There is no hydronephrosis or evidence of mass.  Left Kidney:  The left kidney measures 12.7 cm in sagittal length. A 2.8 x 2.6 x 2.4 cm parapelvic cyst is present.  In the cortex of the mid pole is a 2.9 x 2.6 x 2.4 cm simple cyst.  The renal cortex appears normal in echogenicity and thickness.  There is no hydronephrosis.  Bladder:  Appears normal for degree of distention.  Bilateral ureteral jets are visualized.  IMPRESSION:  1.  Two simple left renal cysts. 3.  Kidneys are normal in size and echogenicity bilaterally. Negative for hydronephrosis.   Original Report Authenticated By: Britta Mccreedy, M.D.     Scheduled Meds: . aspirin  325 mg Oral Daily  . azithromycin  500 mg Oral Q24H  . cefTRIAXone (ROCEPHIN)  IV  1 g Intravenous Q24H  . doxazosin  2 mg Oral q morning  - 10a  . heparin  5,000 Units Subcutaneous Q8H  . insulin aspart  0-15 Units Subcutaneous TID WC  . insulin detemir  10 Units Subcutaneous QHS  . simvastatin  40 mg Oral QHS   Continuous Infusions:   Time spent: 35 minutes.   LOS: 4 days   RAMA,CHRISTINA  [facility] Pager [phone].  If 8PM-8AM, please contact night-coverage at [email or web address], password [facility] 06/29/2012, 1:23 PM

## 2012-06-30 DIAGNOSIS — G92 Toxic encephalopathy: Secondary | ICD-10-CM | POA: Diagnosis present

## 2012-06-30 DIAGNOSIS — D638 Anemia in other chronic diseases classified elsewhere: Secondary | ICD-10-CM | POA: Diagnosis present

## 2012-06-30 DIAGNOSIS — J9600 Acute respiratory failure, unspecified whether with hypoxia or hypercapnia: Secondary | ICD-10-CM | POA: Diagnosis present

## 2012-06-30 DIAGNOSIS — N179 Acute kidney failure, unspecified: Secondary | ICD-10-CM | POA: Diagnosis present

## 2012-06-30 DIAGNOSIS — E876 Hypokalemia: Secondary | ICD-10-CM | POA: Diagnosis present

## 2012-06-30 LAB — BASIC METABOLIC PANEL
CO2: 24 mEq/L (ref 19–32)
Calcium: 8.5 mg/dL (ref 8.4–10.5)
GFR calc non Af Amer: 41 mL/min — ABNORMAL LOW (ref >90)
Potassium: 3.6 mEq/L (ref 3.5–5.1)
Sodium: 137 mEq/L (ref 135–145)

## 2012-06-30 LAB — CBC
MCH: 30.3 pg (ref 26.0–34.0)
Platelets: 263 10*3/uL (ref 150–400)
RBC: 3.47 MIL/uL — ABNORMAL LOW (ref 4.22–5.81)
WBC: 13.3 10*3/uL — ABNORMAL HIGH (ref 4.0–10.5)

## 2012-06-30 LAB — GLUCOSE, CAPILLARY: Glucose-Capillary: 158 mg/dL — ABNORMAL HIGH (ref 70–99)

## 2012-06-30 MED ORDER — CEFUROXIME AXETIL 500 MG PO TABS: 500.0000 mg | ORAL_TABLET | Freq: Two times a day (BID) | ORAL | Status: DC

## 2012-06-30 MED ORDER — OXYCODONE-ACETAMINOPHEN 5-325 MG PO TABS: 1.0000 | ORAL_TABLET | ORAL | Status: DC | PRN

## 2012-06-30 MED ORDER — AMLODIPINE BESYLATE 5 MG PO TABS: 5.0000 mg | ORAL_TABLET | Freq: Every day | ORAL | Status: DC

## 2012-06-30 NOTE — Discharge Summary (Addendum)
Physician Discharge Summary  [name] ZOX:[id number] DOB: 1945/10/19 DOA: 06/25/2012  PCP: Carollee Herter, MD  Admit date: 06/25/2012 Discharge date: 06/30/2012  Recommendations for Outpatient Follow-up:  1. Note: Hydrochlorothiazide/lisinopril on hold until renal function back to baseline values. Since home on Norvasc for blood pressure control. This can be switched back to his usual regimen once he follows up with his primary care physician and has a repeat check of his creatinine. 2. Given referral information for urology. Given the fact that he has had recurrent problems with UTI, recommend outpatient followup with urologist. 3. Followup final blood culture results which have been negative to date.  Discharge Diagnoses:  Principal Problem:    Sepsis secondary to UTI and pneumonia Active Problems:    Diabetes mellitus    Hypertension associated with diabetes    UTI (lower urinary tract infection)    CAP (community acquired pneumonia)    Acute respiratory failure    Anemia of chronic disease    Acute renal failure secondary to ATN and prerenal causes    Hypokalemia    Toxic encephalopathy with delirium   Discharge Condition: Improved.  Diet recommendation: Carbohydrate modified.  History of present illness:  JADIEN LEHIGH is an 67 y.o. male was admitted on 06/26/2012 with toxic encephalopathy related to sepsis from a urinary tract infection. There was also some concern for pneumonia secondary to cough, dyspnea, fever and chills.   Hospital Course by problem:  Principal problem:  Sepsis secondary to UTI and ? PNA (CAP)  - CT chest unremarkable for PNA or fibrosis as suggested by CXR.  - Discharge home on Ceftin for 3 more days.  Status post 5 days of treatment with azithromycin.  - Urine cultures positive for E. Coli, Rocephin sensitive.  Active problems: Toxic encephalopathy with delirium -Resolved with treatment of underlying infections.  Acute  respiratory failure secondary to community-acquired pneumonia  -Status post 5 days of treatment with azithromycin and on day 5 of Rocephin complete a seven-day course of therapy for community-acquired pneumonia. -Sputum culture grew normal oropharyngeal flora.  -Urinary legionella and strep pneumo negative.  -Respiratory status stable at discharge. Anemia of chronic disease  -Hemoglobin stable with no indication for transfusion.  Acute renal failure  -Likely from sepsis and prerenal etiologies.  -Last BUN and Creatinine 04/2102 within normal limits.  -Renal US unremarkable for acute event and no hydronephrosis.  -Bladder scan per nursing staff with less than 130 cc of residual.  -Recurrent problems with UTIs, do recommend outpatient followup with urology.  Hypokalemia  -Repleted.  Diabetes mellitus with complications of HTN  -Last A1C 04/2012 was 6.5 percent.  -Maintained on insulin therapy while in the hospital, can resume normal regimen at discharge.  Hypertension  -Blood pressure elevated. HCTZ/lisinopril on hold secondary to acute kidney injury.  -Discharged on Norvasc while awaiting recovery of renal function to baseline values. UTI (lower urinary tract infection)  -Treated with IV Rocephin. We'll discharge home on an additional 3 days of Ceftin.   Discharge Exam: Filed Vitals:   06/30/12 0514  BP: 164/71  Pulse: 89  Temp: 98.3 F (36.8 C)  Resp: 18   Filed Vitals:   06/29/12 0701 06/29/12 1359 06/29/12 1939 06/30/12 0514  BP: 160/74 177/76 180/78 164/71  Pulse:  100 76 89  Temp:  98.3 F (36.8 C) 98.2 F (36.8 C) 98.3 F (36.8 C)  TempSrc:  Oral Oral Oral  Resp:  18 18 18   Height:      Weight:  SpO2:  97% 98% 97%    Gen:  NAD Cardiovascular:  RRR, No M/R/G Respiratory: Lungs CTAB Gastrointestinal: Abdomen soft, NT/ND with normal active bowel sounds. Extremities: No C/E/C   Discharge Instructions      Discharge Orders   Future Appointments  Provider Department Dept Phone   08/23/2012 9:00 AM Ronnald Nian, MD [facility] [phone]   Future Orders Complete By Expires     Call MD for:  extreme fatigue  As directed     Call MD for:  temperature >100.4  As directed     Call MD for:  As directed     Scheduling Instructions:      Trouble breathing, increased cough.    Diet Carb Modified  As directed     Discharge instructions  As directed     Comments:      Please excuse from [employer], as [name] was hospitalized 06/25/12-06/30/12 and will need time to recuperate.    Increase activity slowly  As directed         Medication List    STOP taking these medications       lisinopril-hydrochlorothiazide 20-12.5 MG per tablet  Commonly known as:  PRINZIDE,ZESTORETIC      TAKE these medications       amLODipine 5 MG tablet  Commonly known as:  NORVASC  Take 1 tablet (5 mg total) by mouth daily.     aspirin 325 MG tablet  Take 325 mg by mouth daily.     cefUROXime 500 MG tablet  Commonly known as:  CEFTIN  Take 1 tablet (500 mg total) by mouth 2 (two) times daily.     cholecalciferol 1000 UNITS tablet  Commonly known as:  VITAMIN D  Take 1,000 Units by mouth daily.     doxazosin 2 MG tablet  Commonly known as:  CARDURA  Take 2 mg by mouth every morning.     glipiZIDE 5 MG 24 hr tablet  Commonly known as:  GLUCOTROL XL  Take 5 mg by mouth 2 (two) times daily.     glucosamine-chondroitin 500-400 MG tablet  Take 1 tablet by mouth 3 (three) times daily.     insulin detemir 100 UNIT/ML injection  Commonly known as:  LEVEMIR  Inject 20 Units into the skin at bedtime.     JANUVIA 100 MG tablet  Generic drug:  sitaGLIPtin  Take 100 mg by mouth every morning.     metFORMIN 1000 MG tablet  Commonly known as:  GLUCOPHAGE  Take 1,000 mg by mouth 2 (two) times daily with a meal.     multivitamin with minerals tablet  Take 1 tablet by mouth daily.     oxyCODONE-acetaminophen 5-325 MG per tablet   Commonly known as:  PERCOCET/ROXICET  Take 1 tablet by mouth every 4 (four) hours as needed.     simvastatin 40 MG tablet  Commonly known as:  ZOCOR  Take 1 tablet (40 mg total) by mouth at bedtime.       Follow-up Information   Follow up with Carollee Herter, MD. Schedule an appointment as soon as possible for a visit in 1 week.   Contact information:   [address] [place] [address] [phone]       Follow up with [facility]. (Call for an appointment.)    Contact information:   [address] [place] [address] [phone]       The results of significant  diagnostics from this hospitalization (including imaging, microbiology, ancillary and laboratory) are listed below for reference.    Significant Diagnostic Studies: Dg Chest 2 View  06/25/2012  *RADIOLOGY REPORT*  Clinical Data: Altered mental status.  Shortness of breath. Diabetes and hypertension.  CHEST - 2 VIEW  Comparison: 12/27/2008  Findings: Borderline heart size with normal pulmonary vascularity. Linear atelectasis or fibrosis in the left lung base appears similar to previous study, favor fibrosis.  Peribronchial thickening and interstitial changes suggest chronic bronchitis.  No focal consolidation.  No pneumothorax.  Mediastinal contours appear intact.  Calcification of the aorta.  Degenerative changes in the spine.  No significant change since previous study.  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  No evidence of active consolidation.   Original Report Authenticated By: Burman Nieves, M.D.    Ct Head Wo Contrast  06/25/2012  *RADIOLOGY REPORT*  Clinical Data: Altered mental status.  CT HEAD WITHOUT CONTRAST  Technique:  Contiguous axial images were obtained from the base of the skull through the vertex without contrast.  Comparison: None.  Findings: Mild diffuse cerebral atrophy.  Low attenuation changes in the  deep white matter consistent with small vessel ischemia. Ventricular dilatation consistent with central atrophy.  No mass effect or midline shift.  No abnormal extra-axial fluid collections.  Gray-white matter junctions are distinct.  Basal cisterns are not effaced.  No evidence of acute intracranial hemorrhage.  No depressed skull fractures.  There is opacification and hypoplasia of the right maxillary antrum with opacification of some of the ethmoid air cells bilaterally.  Mastoid air cells are not opacified.  Vascular calcifications.  IMPRESSION: No acute intracranial abnormalities.  Chronic atrophy and small vessel ischemic changes.  Opacification of right maxillary antrum and ethmoid air cells.   Original Report Authenticated By: Burman Nieves, M.D.    Ct Chest Wo Contrast  06/27/2012  *RADIOLOGY REPORT*  Clinical Data: Left lung fibrosis.  Pneumonia.  CT CHEST WITHOUT CONTRAST  Technique:  Multidetector CT imaging of the chest was performed following the standard protocol without IV contrast.  Comparison: Two-view chest 06/25/2012.  Findings: The heart size is normal.  There is mild prominence of the pulmonary arteries bilaterally.  No significant mediastinal or axillary adenopathy is present.  There is some heterogeneity of the thyroid without a discrete lesion.  Limited imaging of the upper abdomen demonstrates some stranding about the kidneys bilaterally without any discrete lesions.  Atherosclerotic calcifications are noted in the thoracic and upper abdominal aorta without aneurysm. Advanced degenerative changes are noted at the first sternoclavicular joints.  Mild centrilobular emphysematous changes are present.  A peripheral micronodular pattern is present in the left upper lobe which may be post infectious.  No discrete airspace consolidation is evident. No significant fibrotic changes are present.  The bone windows demonstrates multilevel endplate degenerative change.  S-shaped curvature is present  in the upper thoracic spine. The focal sclerotic lesion in the posterior left fifth rib is nonspecific.  No other focal lytic or blastic lesions are present.  IMPRESSION:  1.  No significant pneumonia or fibrosis. 2.  Micronodular pattern in the peripheral left upper lobe may be related to prior or atypical infection. 3.  Well-defined sclerotic lesion of the posterolateral left 5th rib measures 12 mm in maximal dimension.  No other focal lytic or blastic lesions are present. This is well circumscribed and likely represents a bone island without other evidence for metastatic disease. 4.  Degenerative  endplate changes and scoliosis.   Original Report Authenticated By: Marin Roberts, M.D.    US Renal  06/28/2012  *RADIOLOGY REPORT*  Clinical Data: Diabetes mellitus and hypertension. Acute renal failure.  RENAL/URINARY TRACT ULTRASOUND COMPLETE  Comparison:  None  Findings:  Right Kidney:  The right kidney measures 12.2 cm in sagittal length.  Echogenicity and thickness of the renal cortex appear within normal limits.  There is no hydronephrosis or evidence of mass.  Left Kidney:  The left kidney measures 12.7 cm in sagittal length. A 2.8 x 2.6 x 2.4 cm parapelvic cyst is present.  In the cortex of the mid pole is a 2.9 x 2.6 x 2.4 cm simple cyst.  The renal cortex appears normal in echogenicity and thickness.  There is no hydronephrosis.  Bladder:  Appears normal for degree of distention.  Bilateral ureteral jets are visualized.  IMPRESSION:  1.  Two simple left renal cysts. 3.  Kidneys are normal in size and echogenicity bilaterally. Negative for hydronephrosis.   Original Report Authenticated By: Britta Mccreedy, M.D.     Labs:  Basic Metabolic Panel:  Recent Labs Lab 06/26/12 0529 06/27/12 0430 06/28/12 0425 06/29/12 0425 06/30/12 0505  NA 132* 133* 132* 135 137  K 4.0 3.4* 3.7 3.6 3.6  CL 98 101 102 103 102  CO2 21 23 21 24 24   GLUCOSE 297* 102* 108* 117* 179*  BUN 36* 34* 33* 30* 27*   CREATININE 2.21* 2.12* 2.06* 1.80* 1.65*  CALCIUM 8.2* 8.3* 8.4 8.6 8.5   GFR Estimated Creatinine Clearance: 50.6 ml/min (by C-G formula based on Cr of 1.65). Liver Function Tests:  Recent Labs Lab 06/25/12 2105  AST 24  ALT 22  ALKPHOS 79  BILITOT 1.3*  PROT 7.1  ALBUMIN 3.1*    Recent Labs Lab 06/25/12 2105  LIPASE 13   Coagulation profile  Recent Labs Lab 06/25/12 2105  INR 1.13    CBC:  Recent Labs Lab 06/25/12 2105 06/26/12 0529 06/27/12 0430 06/28/12 0425 06/29/12 0425 06/30/12 0505  WBC 24.9* 25.4* 18.6* 16.2* 12.9* 13.3*  NEUTROABS 23.5* 23.1*  --   --   --   --   HGB 11.9* 10.9* 10.8* 10.9* 10.5* 10.5*  HCT 34.9* 32.4* 31.8* 31.0* 30.3* 30.4*  MCV 89.5 90.3 89.1 88.1 88.1 87.6  PLT 228 184 181 189 218 263   Cardiac Enzymes:  Recent Labs Lab 06/25/12 2105  TROPONINI <0.30   CBG:  Recent Labs Lab 06/29/12 0740 06/29/12 1154 06/29/12 1649 06/29/12 2125 06/30/12 0721  GLUCAP 115* 170* 235* 188* 158*   Microbiology Recent Results (from the past 240 hour(s))  URINE CULTURE     Status: None   Collection Time    06/25/12 10:25 PM      Result Value Range Status   Specimen Description URINE, CLEAN CATCH   Final   Special Requests NONE   Final   Culture  Setup Time 06/26/2012 03:34   Final   Colony Count >=100,000 COLONIES/ML   Final   Culture ESCHERICHIA COLI   Final   Report Status 06/28/2012 FINAL   Final   Organism ID, Bacteria ESCHERICHIA COLI   Final  CULTURE, BLOOD (ROUTINE X 2)     Status: None   Collection Time    06/26/12  5:23 AM      Result Value Range Status   Specimen Description BLOOD LEFT HAND   Final   Special Requests BOTTLES DRAWN AEROBIC ONLY 3CC   Final  Culture  Setup Time 06/26/2012 13:44   Final   Culture     Final   Value:        BLOOD CULTURE RECEIVED NO GROWTH TO DATE CULTURE WILL BE HELD FOR 5 DAYS BEFORE ISSUING A FINAL NEGATIVE REPORT   Report Status PENDING   Incomplete  CULTURE, BLOOD (ROUTINE X  2)     Status: None   Collection Time    06/26/12  5:23 AM      Result Value Range Status   Specimen Description BLOOD RIGHT HAND   Final   Special Requests BOTTLES DRAWN AEROBIC ONLY 10CC   Final   Culture  Setup Time 06/26/2012 13:44   Final   Culture     Final   Value:        BLOOD CULTURE RECEIVED NO GROWTH TO DATE CULTURE WILL BE HELD FOR 5 DAYS BEFORE ISSUING A FINAL NEGATIVE REPORT   Report Status PENDING   Incomplete  CULTURE, EXPECTORATED SPUTUM-ASSESSMENT     Status: None   Collection Time    06/26/12  7:32 AM      Result Value Range Status   Specimen Description SPUTUM   Final   Special Requests NONE   Final   Sputum evaluation     Final   Value: MICROSCOPIC FINDINGS SUGGEST THAT THIS SPECIMEN IS NOT REPRESENTATIVE OF LOWER RESPIRATORY SECRETIONS. PLEASE RECOLLECT.     NOTIFIED HODGES,A AT 0842 ON [id number] BY POTEAT,S   Report Status 06/26/2012 FINAL   Final  CULTURE, EXPECTORATED SPUTUM-ASSESSMENT     Status: None   Collection Time    06/27/12  8:15 AM      Result Value Range Status   Specimen Description SPUTUM   Final   Special Requests Normal   Final   Sputum evaluation     Final   Value: THIS SPECIMEN IS ACCEPTABLE. RESPIRATORY CULTURE REPORT TO FOLLOW.   Report Status 06/27/2012 FINAL   Final  CULTURE, RESPIRATORY (NON-EXPECTORATED)     Status: None   Collection Time    06/27/12  9:06 AM      Result Value Range Status   Specimen Description SPUTUM   Final   Special Requests NONE   Final   Gram Stain     Final   Value: RARE WBC PRESENT, PREDOMINANTLY PMN     FEW SQUAMOUS EPITHELIAL CELLS PRESENT     RARE GRAM POSITIVE COCCI     IN PAIRS   Culture NORMAL OROPHARYNGEAL FLORA   Final   Report Status 06/29/2012 FINAL   Final    Time coordinating discharge: 35 minutes.  Signed:  RAMA,CHRISTINA  Pager [phone] [facility] 06/30/2012, 10:00 AM

## 2012-07-02 LAB — CULTURE, BLOOD (ROUTINE X 2): Culture: NO GROWTH

## 2012-07-06 ENCOUNTER — Telehealth: Payer: Self-pay | Admitting: Family Medicine

## 2012-07-06 ENCOUNTER — Ambulatory Visit (INDEPENDENT_AMBULATORY_CARE_PROVIDER_SITE_OTHER): Payer: Medicare HMO | Admitting: Family Medicine

## 2012-07-06 ENCOUNTER — Encounter: Payer: Self-pay | Admitting: Family Medicine

## 2012-07-06 VITALS — BP 148/88 | HR 80 | Wt 234.0 lb

## 2012-07-06 DIAGNOSIS — E119 Type 2 diabetes mellitus without complications: Secondary | ICD-10-CM

## 2012-07-06 DIAGNOSIS — E1169 Type 2 diabetes mellitus with other specified complication: Secondary | ICD-10-CM

## 2012-07-06 DIAGNOSIS — S93402A Sprain of unspecified ligament of left ankle, initial encounter: Secondary | ICD-10-CM

## 2012-07-06 DIAGNOSIS — S93409A Sprain of unspecified ligament of unspecified ankle, initial encounter: Secondary | ICD-10-CM

## 2012-07-06 DIAGNOSIS — I10 Essential (primary) hypertension: Secondary | ICD-10-CM

## 2012-07-06 DIAGNOSIS — Z8669 Personal history of other diseases of the nervous system and sense organs: Secondary | ICD-10-CM

## 2012-07-06 DIAGNOSIS — N390 Urinary tract infection, site not specified: Secondary | ICD-10-CM

## 2012-07-06 LAB — COMPREHENSIVE METABOLIC PANEL
AST: 22 U/L (ref 0–37)
Albumin: 3.7 g/dL (ref 3.5–5.2)
BUN: 18 mg/dL (ref 6–23)
CO2: 25 mEq/L (ref 19–32)
Calcium: 9.5 mg/dL (ref 8.4–10.5)
Creat: 1.23 mg/dL (ref 0.50–1.35)
Sodium: 140 mEq/L (ref 135–145)

## 2012-07-06 MED ORDER — LISINOPRIL-HYDROCHLOROTHIAZIDE 20-12.5 MG PO TABS: 1.0000 | ORAL_TABLET | Freq: Every day | ORAL | Status: DC

## 2012-07-06 NOTE — Telephone Encounter (Signed)
SENT THIS IN 

## 2012-07-06 NOTE — Progress Notes (Signed)
  Subjective:    Patient ID: [name], male    DOB: 02/26/1946, 67 y.o.   MRN: [id number]  HPI He is here for recheck after recent hospitalization for UTI growing Escherichia coli. He also had an encephalopathy from this. His renal functions were also affected and his ACE inhibitor was discontinued pending renal function is returning to normal. Presently he is having no frequency he states he urinates roughly every 2 hours which is his norm. No fever, chills, chest congestion. He is slowly gaining his strength back. He plans to return to work tomorrow. He also set up to see urology tomorrow for followup on his infection. He also is taking ibuprofen on a regular basis and stopped this due to the renal issues. He is now taking Tylenol.   Review of Systems     Objective:   Physical Exam alert and in no distress. Tympanic membranes and canals are normal. Throat is clear. Tonsils are normal. Neck is supple without adenopathy or thyromegaly. Cardiac exam shows a regular sinus rhythm without murmurs or gallops. Lungs are clear to auscultation.        Assessment & Plan:  UTI (lower urinary tract infection) - Plan: CBC with Differential, Comprehensive metabolic panel  History of toxic encephalopathy - Plan: CBC with Differential, Comprehensive metabolic panel  Diabetes mellitus  Hypertension associated with diabetes - Plan: CBC with Differential, Comprehensive metabolic panel  Left ankle sprain, initial encounter recommend conservative care for the ankle. Recommend he exercise and do range of motion for this. I will also followup pending results of blood work concerning his ACE inhibitor. Encouraged him to continue Tylenol for pain relief.

## 2012-07-06 NOTE — Patient Instructions (Signed)
I will call you concerning getting back on your blood pressure medication. Use the wrap on your ankle as needed and make sure you do exercises for your ankle. You may return to work when you feel up to it.

## 2012-07-06 NOTE — Telephone Encounter (Signed)
SENT IN B/P MES

## 2012-07-06 NOTE — Telephone Encounter (Signed)
[facility] req refill for Lisinopril hctz 20/12.5

## 2012-07-07 LAB — CBC WITH DIFFERENTIAL/PLATELET
Basophils Relative: 1 % (ref 0–1)
Eosinophils Absolute: 0.2 10*3/uL (ref 0.0–0.7)
Lymphs Abs: 3 10*3/uL (ref 0.7–4.0)
MCH: 29.8 pg (ref 26.0–34.0)
Neutro Abs: 10.5 10*3/uL — ABNORMAL HIGH (ref 1.7–7.7)
Neutrophils Relative %: 71 % (ref 43–77)
Platelets: 528 10*3/uL — ABNORMAL HIGH (ref 150–400)
RBC: 3.83 MIL/uL — ABNORMAL LOW (ref 4.22–5.81)

## 2012-07-11 NOTE — Progress Notes (Signed)
  Subjective:    Patient ID: [name], male    DOB: 1945-09-27, 67 y.o.   MRN: [id number]  HPI He also injured his ankle 2 weeks ago and continues to have difficulty with this.   Review of Systems     Objective:   Physical Exam Full motion of the ankle. No laxity noted. No tenderness to palpation.       Assessment & Plan:  Ankle sprain. Conservative care recommended.

## 2012-08-23 ENCOUNTER — Ambulatory Visit (INDEPENDENT_AMBULATORY_CARE_PROVIDER_SITE_OTHER): Payer: Medicare HMO | Admitting: Family Medicine

## 2012-08-23 ENCOUNTER — Encounter: Payer: Self-pay | Admitting: Family Medicine

## 2012-08-23 VITALS — BP 120/70 | HR 76 | Wt 227.0 lb

## 2012-08-23 DIAGNOSIS — E785 Hyperlipidemia, unspecified: Secondary | ICD-10-CM

## 2012-08-23 DIAGNOSIS — E669 Obesity, unspecified: Secondary | ICD-10-CM

## 2012-08-23 DIAGNOSIS — E119 Type 2 diabetes mellitus without complications: Secondary | ICD-10-CM

## 2012-08-23 DIAGNOSIS — I10 Essential (primary) hypertension: Secondary | ICD-10-CM

## 2012-08-23 DIAGNOSIS — E1169 Type 2 diabetes mellitus with other specified complication: Secondary | ICD-10-CM

## 2012-08-23 DIAGNOSIS — E1159 Type 2 diabetes mellitus with other circulatory complications: Secondary | ICD-10-CM

## 2012-08-23 DIAGNOSIS — N529 Male erectile dysfunction, unspecified: Secondary | ICD-10-CM

## 2012-08-23 LAB — POCT GLYCOSYLATED HEMOGLOBIN (HGB A1C): Hemoglobin A1C: 7.4

## 2012-08-23 MED ORDER — SILDENAFIL CITRATE 100 MG PO TABS: 50.0000 mg | ORAL_TABLET | Freq: Every day | ORAL | Status: DC | PRN

## 2012-08-23 NOTE — Progress Notes (Signed)
Subjective:    [name] is a 67 y.o. male who presents for follow-up of Type 2 diabetes mellitus.    Home blood sugar records: 150 AM   Current symptoms/problems include NONE Daily foot checks, foot concerns:YES  NOT A GOOD PULSE PER NURSE FROM [facility]  Last eye exam:  GOULD   Medication compliance: good Current diet: NO Current exercise: WALKING Known diabetic complications: impotence Cardiovascular risk factors: advanced age (older than 20 for men, 98 for women), diabetes mellitus, dyslipidemia, hypertension, male gender, obesity (BMI >= 30 kg/m2) and sedentary lifestyle He has had a hard time getting back to his routine physical activity due to the recent hospitalization. His weight is however down. He would also like to try Viagra again. He did that in the past and apparently it did help.  The following portions of the patient's history were reviewed and updated as appropriate: allergies, current medications, past family history, past medical history, past social history, past surgical history and problem list.  ROS as in subjective above    Objective:    BP 120/70  Pulse 76  Wt 227 lb (102.967 kg)  BMI 34.52 kg/m2  Filed Vitals:   08/23/12 0846  BP: 120/70  Pulse: 76    General appearence: alert, no distress, WD/WN   Lab Review Lab Results  Component Value Date   HGBA1C 6.5% 04/25/2012   Lab Results  Component Value Date   CHOL 128 04/25/2012   HDL 45 04/25/2012   LDLCALC 66 04/25/2012   TRIG 84 04/25/2012   CHOLHDL 2.8 04/25/2012   No results found for this basenameConcepcion Elk     Chemistry      Component Value Date/Time   NA 140 07/06/2012 1215   K 4.4 07/06/2012 1215   CL 101 07/06/2012 1215   CO2 25 07/06/2012 1215   BUN 18 07/06/2012 1215   CREATININE 1.23 07/06/2012 1215   CREATININE 1.65* 06/30/2012 0505      Component Value Date/Time   CALCIUM 9.5 07/06/2012 1215   ALKPHOS 172* 07/06/2012 1215   AST 22 07/06/2012 1215   ALT 60*  07/06/2012 1215   BILITOT 0.5 07/06/2012 1215        Chemistry      Component Value Date/Time   NA 140 07/06/2012 1215   K 4.4 07/06/2012 1215   CL 101 07/06/2012 1215   CO2 25 07/06/2012 1215   BUN 18 07/06/2012 1215   CREATININE 1.23 07/06/2012 1215   CREATININE 1.65* 06/30/2012 0505      Component Value Date/Time   CALCIUM 9.5 07/06/2012 1215   ALKPHOS 172* 07/06/2012 1215   AST 22 07/06/2012 1215   ALT 60* 07/06/2012 1215   BILITOT 0.5 07/06/2012 1215     Hemoglobin A1c is 7.4  Last optometry/ophthalmology exam reviewed from: Pt will set it up    Assessment:   Encounter Diagnoses  Name Primary?  . Diabetes mellitus Yes  . Hyperlipidemia LDL goal <70   . Hypertension associated with diabetes   . Obesity (BMI 30-39.9)   . ED (erectile dysfunction)          Plan:    1.  Rx changes: Viagra 2.  Education: Reviewed 'ABCs' of diabetes management (respective goals in parentheses):  A1C (<7), blood pressure (<130/80), and cholesterol (LDL <100). 3.  Compliance at present is estimated to be poor. Efforts to improve compliance (if necessary) will be directed at dietary modifications: Continue to and increased exercise. 4. Follow up:  4 months  I discussed the use of Viagra with his other medications and inform them that he has a relative contraindication because of the Cardura. He understands this and is willing to assume that risk.

## 2012-08-23 NOTE — Patient Instructions (Signed)
Walk everyday for half an hour

## 2012-09-12 ENCOUNTER — Other Ambulatory Visit: Payer: Self-pay | Admitting: Family Medicine

## 2012-09-15 ENCOUNTER — Telehealth: Payer: Self-pay | Admitting: Family Medicine

## 2012-09-15 MED ORDER — DOXAZOSIN MESYLATE 2 MG PO TABS: ORAL_TABLET | ORAL | Status: DC

## 2012-09-15 MED ORDER — SIMVASTATIN 40 MG PO TABS: 40.0000 mg | ORAL_TABLET | Freq: Every day | ORAL | Status: DC

## 2012-09-15 NOTE — Telephone Encounter (Signed)
DID NOT KNOW EXACT MED HE WANTED REFILLED SENT IN THE MOST RECENT REFILLS 90 DAYS

## 2012-09-15 NOTE — Telephone Encounter (Signed)
Pt called and would like all of his rx to be for 90 days to [facility] on [address].

## 2012-10-24 ENCOUNTER — Other Ambulatory Visit: Payer: Self-pay | Admitting: Family Medicine

## 2012-10-31 LAB — HM DIABETES EYE EXAM

## 2012-11-01 ENCOUNTER — Encounter: Payer: Self-pay | Admitting: Internal Medicine

## 2012-11-10 ENCOUNTER — Encounter: Payer: Self-pay | Admitting: Family Medicine

## 2012-12-27 ENCOUNTER — Ambulatory Visit: Payer: Medicare HMO | Admitting: Family Medicine

## 2013-01-03 ENCOUNTER — Encounter: Payer: Self-pay | Admitting: Family Medicine

## 2013-01-03 ENCOUNTER — Other Ambulatory Visit: Payer: Self-pay | Admitting: Family Medicine

## 2013-01-03 ENCOUNTER — Ambulatory Visit (INDEPENDENT_AMBULATORY_CARE_PROVIDER_SITE_OTHER): Payer: Medicare HMO | Admitting: Family Medicine

## 2013-01-03 VITALS — BP 120/78 | HR 68 | Wt 231.0 lb

## 2013-01-03 DIAGNOSIS — E119 Type 2 diabetes mellitus without complications: Secondary | ICD-10-CM

## 2013-01-03 DIAGNOSIS — E669 Obesity, unspecified: Secondary | ICD-10-CM

## 2013-01-03 DIAGNOSIS — E1169 Type 2 diabetes mellitus with other specified complication: Secondary | ICD-10-CM

## 2013-01-03 DIAGNOSIS — S93402A Sprain of unspecified ligament of left ankle, initial encounter: Secondary | ICD-10-CM

## 2013-01-03 DIAGNOSIS — Z23 Encounter for immunization: Secondary | ICD-10-CM

## 2013-01-03 DIAGNOSIS — I10 Essential (primary) hypertension: Secondary | ICD-10-CM

## 2013-01-03 DIAGNOSIS — E785 Hyperlipidemia, unspecified: Secondary | ICD-10-CM

## 2013-01-03 DIAGNOSIS — E1159 Type 2 diabetes mellitus with other circulatory complications: Secondary | ICD-10-CM

## 2013-01-03 LAB — POCT GLYCOSYLATED HEMOGLOBIN (HGB A1C): Hemoglobin A1C: 7.1

## 2013-01-03 NOTE — Progress Notes (Signed)
Subjective:    [name] is a 67 y.o. male who presents for follow-up of Type 2 diabetes mellitus.    Home blood sugar records 132 AVG  Current symptoms/problems NONE Daily foot checks:  Any foot concerns: YES/LEFT FOOT  Last eye exam:  2014. Left cataract surgery one week ago   Medication compliance:good Current diet: NONE Current exercise: WALKING  Known diabetic complications: none Cardiovascular risk factors: advanced age (older than 104 for men, 23 for women), diabetes mellitus, dyslipidemia, hypertension, male gender, obesity (BMI >= 30 kg/m2) and sedentary lifestyle States that he injured his foot several months ago and is still swollen laterally. He is not having any difficulty with this. The following portions of the patient's history were reviewed and updated as appropriate: allergies, current medications, past family history, past medical history, past social history, past surgical history and problem list.  ROS as in subjective above    Objective:    General appearence: alert, no distress, WD/WN  Foot exam: Shows normal motion of the ankle. No swelling noted laterally. No tenderness over the ATF. No laxity noted.   Lab Review Lab Results  Component Value Date   HGBA1C 7.4 08/23/2012   Lab Results  Component Value Date   CHOL 128 04/25/2012   HDL 45 04/25/2012   LDLCALC 66 04/25/2012   TRIG 84 04/25/2012   CHOLHDL 2.8 04/25/2012   No results found for this basename: Concepcion Elk     Chemistry      Component Value Date/Time   NA 140 07/06/2012 1215   K 4.4 07/06/2012 1215   CL 101 07/06/2012 1215   CO2 25 07/06/2012 1215   BUN 18 07/06/2012 1215   CREATININE 1.23 07/06/2012 1215   CREATININE 1.65* 06/30/2012 0505      Component Value Date/Time   CALCIUM 9.5 07/06/2012 1215   ALKPHOS 172* 07/06/2012 1215   AST 22 07/06/2012 1215   ALT 60* 07/06/2012 1215   BILITOT 0.5 07/06/2012 1215        Chemistry      Component Value Date/Time   NA 140 07/06/2012  1215   K 4.4 07/06/2012 1215   CL 101 07/06/2012 1215   CO2 25 07/06/2012 1215   BUN 18 07/06/2012 1215   CREATININE 1.23 07/06/2012 1215   CREATININE 1.65* 06/30/2012 0505      Component Value Date/Time   CALCIUM 9.5 07/06/2012 1215   ALKPHOS 172* 07/06/2012 1215   AST 22 07/06/2012 1215   ALT 60* 07/06/2012 1215   BILITOT 0.5 07/06/2012 1215       Hemoglobin A1c is 7.1   Assessment:  Diabetes mellitus - Plan: POCT glycosylated hemoglobin (Hb A1C), US Aorta Initial Medicare Screen, US Arterial Seg Multiple, CANCELED: Ankle brachial index  Need for prophylactic vaccination and inoculation against influenza - Plan: Flu vaccine HIGH DOSE PF (Fluzone Tri High dose)  Left ankle sprain, initial encounter  Obesity (BMI 30-39.9)  Hypertension associated with diabetes  Hyperlipidemia LDL goal <70        Plan:    1.  Rx changes: none 2.  Education: Reviewed 'ABCs' of diabetes management (respective goals in parentheses):  A1C (<7), blood pressure (<130/80), and cholesterol (LDL <100). 3.  Compliance at present is estimated to be good. Efforts to improve compliance (if necessary) will be directed at increased exercise. 4. Follow up: 4 months  Supportive care for the foot. Again discussed the need for him to make further exercise changes. He was observed having  difficulty putting his shoes on due to his abdominal girth.

## 2013-01-06 ENCOUNTER — Other Ambulatory Visit: Payer: Medicare HMO

## 2013-01-06 ENCOUNTER — Ambulatory Visit: Payer: Medicare HMO

## 2013-01-09 ENCOUNTER — Telehealth: Payer: Self-pay | Admitting: Family Medicine

## 2013-01-09 NOTE — Telephone Encounter (Signed)
Go ahead and call this in 

## 2013-01-09 NOTE — Telephone Encounter (Signed)
Make sure that he is taking the insulin. Not sure he is still on it

## 2013-01-09 NOTE — Telephone Encounter (Signed)
CALLED AND TALKED TO PT HE IS STILL TAKING 20 UNITS QD AND PHARMACY WAS NOTIFIED OK TO CHANGE TO FLEX TOUCH

## 2013-01-12 ENCOUNTER — Ambulatory Visit
Admission: RE | Admit: 2013-01-12 | Discharge: 2013-01-12 | Disposition: A | Discharge status: Home / Self Care | Payer: Medicare HMO | Source: Ambulatory Visit | Attending: Family Medicine | Admitting: Family Medicine

## 2013-01-12 ENCOUNTER — Telehealth: Payer: Self-pay | Admitting: Family Medicine

## 2013-01-12 DIAGNOSIS — E119 Type 2 diabetes mellitus without complications: Secondary | ICD-10-CM

## 2013-01-12 NOTE — Addendum Note (Signed)
Addended by: Laureen Ochs F on: 01/12/2013 09:41 AM   Modules accepted: Orders

## 2013-01-12 NOTE — Telephone Encounter (Signed)
Misha with [facility] called [phone] she states pt had a screening Aorta ultrasound 1 year ago and medicaid will not pay for another screening.  We changed order to ultrasound aorta (not screening) .  Pt is there now.  The screening 1 year ago was negative to rule out aneurysm.

## 2013-03-17 ENCOUNTER — Other Ambulatory Visit: Payer: Self-pay | Admitting: Family Medicine

## 2013-03-30 ENCOUNTER — Telehealth: Payer: Self-pay | Admitting: Family Medicine

## 2013-03-30 NOTE — Telephone Encounter (Signed)
Pt is requesting samples of Januvia. Call pt when ready.

## 2013-04-07 NOTE — Telephone Encounter (Signed)
Per Gabriel Cirri samples given

## 2013-04-18 ENCOUNTER — Other Ambulatory Visit: Payer: Self-pay | Admitting: Family Medicine

## 2013-05-02 ENCOUNTER — Other Ambulatory Visit: Payer: Self-pay | Admitting: Family Medicine

## 2013-05-02 ENCOUNTER — Telehealth: Payer: Self-pay | Admitting: Family Medicine

## 2013-05-02 MED ORDER — SITAGLIPTIN PHOSPHATE 100 MG PO TABS: 50.0000 mg | ORAL_TABLET | Freq: Every morning | ORAL | Status: DC

## 2013-05-02 NOTE — Telephone Encounter (Signed)
Pt has a upcoming appt with his eye doc. Pt has [employer] ins. And needs referral. Eye doc is Dr. Sharyne Peach.

## 2013-05-02 NOTE — Telephone Encounter (Signed)
Faxed referral to get approved to [facility]

## 2013-05-08 ENCOUNTER — Ambulatory Visit (INDEPENDENT_AMBULATORY_CARE_PROVIDER_SITE_OTHER): Payer: Medicare HMO | Admitting: Family Medicine

## 2013-05-08 ENCOUNTER — Telehealth: Payer: Self-pay | Admitting: Family Medicine

## 2013-05-08 ENCOUNTER — Encounter: Payer: Self-pay | Admitting: Family Medicine

## 2013-05-08 VITALS — BP 122/60 | HR 72 | Temp 98.1°F | Wt 230.0 lb

## 2013-05-08 DIAGNOSIS — Z6379 Other stressful life events affecting family and household: Secondary | ICD-10-CM

## 2013-05-08 DIAGNOSIS — E669 Obesity, unspecified: Secondary | ICD-10-CM

## 2013-05-08 DIAGNOSIS — E1169 Type 2 diabetes mellitus with other specified complication: Secondary | ICD-10-CM

## 2013-05-08 DIAGNOSIS — E119 Type 2 diabetes mellitus without complications: Secondary | ICD-10-CM

## 2013-05-08 DIAGNOSIS — I10 Essential (primary) hypertension: Secondary | ICD-10-CM

## 2013-05-08 DIAGNOSIS — E785 Hyperlipidemia, unspecified: Secondary | ICD-10-CM

## 2013-05-08 DIAGNOSIS — E1159 Type 2 diabetes mellitus with other circulatory complications: Secondary | ICD-10-CM

## 2013-05-08 LAB — POCT GLYCOSYLATED HEMOGLOBIN (HGB A1C): Hemoglobin A1C: 7.6

## 2013-05-08 MED ORDER — SIMVASTATIN 40 MG PO TABS
40.0000 mg | ORAL_TABLET | Freq: Every day | ORAL | Status: DC
Start: 2013-05-08 — End: 2014-04-17

## 2013-05-08 MED ORDER — SITAGLIPTIN PHOSPHATE 100 MG PO TABS: 50.0000 mg | ORAL_TABLET | Freq: Every morning | ORAL | Status: DC

## 2013-05-08 MED ORDER — DOXAZOSIN MESYLATE 2 MG PO TABS: ORAL_TABLET | ORAL | Status: DC

## 2013-05-08 MED ORDER — LISINOPRIL-HYDROCHLOROTHIAZIDE 20-12.5 MG PO TABS: 1.0000 | ORAL_TABLET | Freq: Every day | ORAL | Status: DC

## 2013-05-08 MED ORDER — INSULIN DETEMIR 100 UNIT/ML ~~LOC~~ SOLN: 24.0000 [IU] | Freq: Every day | SUBCUTANEOUS | Status: DC

## 2013-05-08 MED ORDER — METFORMIN HCL 1000 MG PO TABS: 1000.0000 mg | ORAL_TABLET | Freq: Two times a day (BID) | ORAL | Status: DC

## 2013-05-08 NOTE — Telephone Encounter (Signed)
Go ahead and call this in 

## 2013-05-08 NOTE — Telephone Encounter (Signed)
levemir was already sent to pharmacy

## 2013-05-08 NOTE — Progress Notes (Signed)
   Subjective:    Patient ID: [name], male    DOB: [birth year], 68 y.o.   MRN: [id number]  HPI 5 days ago he developed sinus congestion with rhinorrhea, sore throat, chest congestion and now coughing. He continues on medications listed in the chart. He has been checking blood sugars and they have been elevated .He has increased his insulin dosing to 24 units and notes that his eating habits have been out of control mainly due to family stress. Apparently an uncle recently died that he was very close to. He has an eye exam set up for Thursday. His exercise is quite minimal. Social history again was reviewed. He checks his feet intermittently. Smoking and drinking were reviewed.   Review of Systems     Objective:   Physical Exam Alert and in no distress. Hemoglobin A1c is 7.6       Assessment & Plan:  Type II or unspecified type diabetes mellitus without mention of complication, not stated as uncontrolled - Plan: HgB A1c  Diabetes mellitus - Plan: insulin detemir (LEVEMIR) 100 UNIT/ML injection, metFORMIN (GLUCOPHAGE) 1000 MG tablet, sitaGLIPtin (JANUVIA) 100 MG tablet  Hyperlipidemia LDL goal <70 - Plan: simvastatin (ZOCOR) 40 MG tablet  Hypertension associated with diabetes - Plan: doxazosin (CARDURA) 2 MG tablet, lisinopril-hydrochlorothiazide (ZESTORETIC) 20-12.5 MG per tablet  Obesity (BMI 30-39.9)  Stress due to illness of family member  I discussed stress and stress management with him especially in regard to his eating habits. Discussed the need for him to make diet and exercise changes. He did mention the use of other medications. I again explained that he would be much better for him to make diet and exercise changes as opposed to switching his medications around. We will readdress the medication management with his next visit and may need to be adjusted at that point the

## 2013-05-08 NOTE — Telephone Encounter (Signed)
Arp  [id number] has requested Levemir Solution 10 ml.  Inject 24 unites qd qhs.

## 2013-05-22 NOTE — Telephone Encounter (Signed)
dt ?

## 2013-06-22 ENCOUNTER — Telehealth: Payer: Self-pay | Admitting: Internal Medicine

## 2013-06-22 NOTE — Telephone Encounter (Signed)
Faxed over medical records to [facility] on march 26 to [phone]

## 2013-08-18 ENCOUNTER — Other Ambulatory Visit: Payer: Self-pay | Admitting: Family Medicine

## 2013-09-06 ENCOUNTER — Encounter: Payer: Self-pay | Admitting: Family Medicine

## 2013-09-06 ENCOUNTER — Ambulatory Visit (INDEPENDENT_AMBULATORY_CARE_PROVIDER_SITE_OTHER): Payer: Medicare HMO | Admitting: Family Medicine

## 2013-09-06 VITALS — BP 130/64 | Wt 234.0 lb

## 2013-09-06 DIAGNOSIS — Z9119 Patient's noncompliance with other medical treatment and regimen: Secondary | ICD-10-CM

## 2013-09-06 DIAGNOSIS — E119 Type 2 diabetes mellitus without complications: Secondary | ICD-10-CM

## 2013-09-06 DIAGNOSIS — I10 Essential (primary) hypertension: Secondary | ICD-10-CM

## 2013-09-06 DIAGNOSIS — E669 Obesity, unspecified: Secondary | ICD-10-CM

## 2013-09-06 DIAGNOSIS — Z91199 Patient's noncompliance with other medical treatment and regimen due to unspecified reason: Secondary | ICD-10-CM

## 2013-09-06 DIAGNOSIS — E1159 Type 2 diabetes mellitus with other circulatory complications: Secondary | ICD-10-CM

## 2013-09-06 DIAGNOSIS — E1169 Type 2 diabetes mellitus with other specified complication: Secondary | ICD-10-CM

## 2013-09-06 DIAGNOSIS — B070 Plantar wart: Secondary | ICD-10-CM

## 2013-09-06 DIAGNOSIS — E785 Hyperlipidemia, unspecified: Secondary | ICD-10-CM

## 2013-09-06 LAB — POCT GLYCOSYLATED HEMOGLOBIN (HGB A1C): Hemoglobin A1C: 7.5

## 2013-09-06 MED ORDER — GLIPIZIDE ER 5 MG PO TB24: ORAL_TABLET | ORAL | Status: DC

## 2013-09-06 MED ORDER — INSULIN PEN NEEDLE 31G X 8 MM MISC: 2.0000 | Freq: Two times a day (BID) | Status: DC

## 2013-09-06 MED ORDER — SITAGLIPTIN PHOSPHATE 100 MG PO TABS: 100.0000 mg | ORAL_TABLET | Freq: Every day | ORAL | Status: DC

## 2013-09-06 NOTE — Patient Instructions (Addendum)
It is up to yuu to decide whether you to take better care of yourself and not let things get in the way. You have the tools,you just need to use them. Use Compound W as directed

## 2013-09-06 NOTE — Progress Notes (Signed)
   Subjective:    Patient ID: [name], male    DOB: 1945/11/02, 68 y.o.   MRN: [id number]  HPI He is here for a recheck. He states that his blood sugars are elevated but he is not sure what to do. He has been through diabetes education. He states that his wife says that he is snacking too much however when I quizzed him he was unsure about this. His exercise has been minimal and he blames his wife's illness as well as an other family members illness interfering with him taking good care of himself. He continues on medications listed in the chart. Does check his feet and does have a lesion on [place] would like me to look at. He has had an eye exam within the last year.   Review of Systems     Objective:   Physical Exam Alert and in no distress. Hemoglobin A1c is 7.5. Exam of his right foot does show a wart present on the midportion of the ball of the foot.       Assessment & Plan:  Diabetes mellitus - Plan: HgB A1c, glipiZIDE (GLUCOTROL XL) 5 MG 24 hr tablet, sitaGLIPtin (JANUVIA) 100 MG tablet  Plantar wart  Hyperlipidemia LDL goal <70  Hypertension associated with diabetes  Obesity (BMI 30-39.9)  Personal history of noncompliance with medical treatment, presenting hazards to health  recommended the use of Compound W. Explained how to use this on a weekly cycle. He is to continue on his medications. I expressed my dismay and him not taking good care of himself and acting as if he did know what to do with elevated blood sugars. I reinforced the need for him to look at his diet and especially exercise. I told him that he is being one that can improve his health. All I can do his give guidance and suggestions but I cannot make him take better care of himself.

## 2013-09-13 ENCOUNTER — Other Ambulatory Visit: Payer: Self-pay | Admitting: Medical

## 2013-09-13 ENCOUNTER — Other Ambulatory Visit: Payer: Self-pay | Admitting: Family Medicine

## 2013-12-04 ENCOUNTER — Ambulatory Visit (INDEPENDENT_AMBULATORY_CARE_PROVIDER_SITE_OTHER): Payer: Medicare HMO | Admitting: Family Medicine

## 2013-12-04 ENCOUNTER — Encounter: Payer: Self-pay | Admitting: Family Medicine

## 2013-12-04 VITALS — HR 81 | Wt 238.0 lb

## 2013-12-04 DIAGNOSIS — M722 Plantar fascial fibromatosis: Secondary | ICD-10-CM

## 2013-12-04 NOTE — Patient Instructions (Addendum)
Plantar Fasciitis Plantar fasciitis is a common condition that causes foot pain. It is soreness (inflammation) of the band of tough fibrous tissue on the bottom of the foot that runs from the heel bone (calcaneus) to the ball of the foot. The cause of this soreness may be from excessive standing, poor fitting shoes, running on hard surfaces, being overweight, having an abnormal walk, or overuse (this is common in runners) of the painful foot or feet. It is also common in aerobic exercise dancers and ballet dancers. SYMPTOMS  Most people with plantar fasciitis complain of:  Severe pain in the morning on the bottom of their foot especially when taking the first steps out of bed. This pain recedes after a few minutes of walking.  Severe pain is experienced also during walking following a long period of inactivity.  Pain is worse when walking barefoot or up stairs DIAGNOSIS   Your caregiver will diagnose this condition by examining and feeling your foot.  Special tests such as X-rays of your foot, are usually not needed. PREVENTION   Consult a sports medicine professional before beginning a new exercise program.  Walking programs offer a good workout. With walking there is a lower chance of overuse injuries common to runners. There is less impact and less jarring of the joints.  Begin all new exercise programs slowly. If problems or pain develop, decrease the amount of time or distance until you are at a comfortable level.  Wear good shoes and replace them regularly.  Stretch your foot and the heel cords at the back of the ankle (Achilles tendon) both before and after exercise.  Run or exercise on even surfaces that are not hard. For example, asphalt is better than pavement.  Do not run barefoot on hard surfaces.  If using a treadmill, vary the incline.  Do not continue to workout if you have foot or joint problems. Seek professional help if they do not improve. HOME CARE INSTRUCTIONS     Avoid activities that cause you pain until you recover.  Use ice or cold packs on the problem or painful areas after working out.  Only take over-the-counter or prescription medicines for pain, discomfort, or fever as directed by your caregiver.  Soft shoe inserts or athletic shoes with air or gel sole cushions may be helpful.  If problems continue or become more severe, consult a sports medicine caregiver or your own health care provider. Cortisone is a potent anti-inflammatory medication that may be injected into the painful area. You can discuss this treatment with your caregiver. MAKE SURE YOU:   Understand these instructions.  Will watch your condition.  Will get help right away if you are not doing well or get worse. Document Released: 12/02/2000 Document Revised: 06/01/2011 Document Reviewed: 02/01/2008 [facility] Patient Information 2015 [place], [place]. This information is not intended to replace advice given to you by your health care provider. Make sure you discuss any questions you have with your health care provider. Use the heel cups for about a month and if that doesn't work switch to arch supports and make sure you do good stretching

## 2013-12-05 NOTE — Progress Notes (Signed)
   Subjective:    Patient ID: [name], male    DOB: November 16, 1945, 68 y.o.   MRN: [id number]  HPI He is here for evaluation of a three-week history of right heel pain radiating to the ball of the foot. The pain is worse especially when he gets up. He has a previous history of difficulty with this and did have an injection by the doctors which did help for short period of time. He has been taking OTC medications for this. He also been working on a plantar wart using Compound W on the same foot. He does have underlying diabetes  Review of Systems     Objective:   Physical Exam Alert and in no distress. Exam of the foot does show the plantar wart with whitish tissue and surrounding slight erythema over the ball of the foot near the second MTP. He is tender to palpation over the calcaneal spur.       Assessment & Plan:  Plantar fasciitis of right foot  commend stretching, anti-inflammatory and heel cups and or arch supports. If continued difficulty he is to return here. He is to continue to treat his plantar wart.

## 2013-12-21 ENCOUNTER — Other Ambulatory Visit: Payer: Self-pay | Admitting: Family Medicine

## 2014-01-04 ENCOUNTER — Encounter: Payer: Self-pay | Admitting: Family Medicine

## 2014-01-04 ENCOUNTER — Ambulatory Visit (INDEPENDENT_AMBULATORY_CARE_PROVIDER_SITE_OTHER): Payer: Medicare HMO | Admitting: Family Medicine

## 2014-01-04 VITALS — Ht 68.0 in | Wt 232.0 lb

## 2014-01-04 DIAGNOSIS — E669 Obesity, unspecified: Secondary | ICD-10-CM

## 2014-01-04 DIAGNOSIS — Z23 Encounter for immunization: Secondary | ICD-10-CM

## 2014-01-04 DIAGNOSIS — I10 Essential (primary) hypertension: Secondary | ICD-10-CM

## 2014-01-04 DIAGNOSIS — E785 Hyperlipidemia, unspecified: Secondary | ICD-10-CM

## 2014-01-04 DIAGNOSIS — E119 Type 2 diabetes mellitus without complications: Secondary | ICD-10-CM

## 2014-01-04 DIAGNOSIS — E1159 Type 2 diabetes mellitus with other circulatory complications: Secondary | ICD-10-CM

## 2014-01-04 DIAGNOSIS — Z79899 Other long term (current) drug therapy: Secondary | ICD-10-CM

## 2014-01-04 DIAGNOSIS — E1169 Type 2 diabetes mellitus with other specified complication: Secondary | ICD-10-CM

## 2014-01-04 LAB — CBC WITH DIFFERENTIAL/PLATELET
BASOS PCT: 1 % (ref 0–1)
Basophils Absolute: 0.1 10*3/uL (ref 0.0–0.1)
Eosinophils Absolute: 0.3 10*3/uL (ref 0.0–0.7)
Eosinophils Relative: 3 % (ref 0–5)
HCT: 39.8 % (ref 39.0–52.0)
Hemoglobin: 13.6 g/dL (ref 13.0–17.0)
Lymphocytes Relative: 27 % (ref 12–46)
Lymphs Abs: 2.6 10*3/uL (ref 0.7–4.0)
MCH: 30.2 pg (ref 26.0–34.0)
MCHC: 34.2 g/dL (ref 30.0–36.0)
MCV: 88.4 fL (ref 78.0–100.0)
Monocytes Absolute: 0.8 10*3/uL (ref 0.1–1.0)
Monocytes Relative: 8 % (ref 3–12)
NEUTROS ABS: 5.9 10*3/uL (ref 1.7–7.7)
NEUTROS PCT: 61 % (ref 43–77)
Platelets: 317 10*3/uL (ref 150–400)
RBC: 4.5 MIL/uL (ref 4.22–5.81)
RDW: 13.6 % (ref 11.5–15.5)
WBC: 9.7 10*3/uL (ref 4.0–10.5)

## 2014-01-04 LAB — LIPID PANEL
Cholesterol: 131 mg/dL (ref 0–200)
HDL: 53 mg/dL (ref >39)
LDL CALC: 57 mg/dL (ref 0–99)
Total CHOL/HDL Ratio: 2.5 Ratio
Triglycerides: 107 mg/dL (ref <150)
VLDL: 21 mg/dL (ref 0–40)

## 2014-01-04 LAB — POCT UA - MICROALBUMIN
Albumin/Creatinine Ratio, Urine, POC: 5.4
Creatinine, POC: 97.8 mg/dL
MICROALBUMIN (UR) POC: 5.3 mg/L

## 2014-01-04 LAB — COMPREHENSIVE METABOLIC PANEL
ALBUMIN: 4.5 g/dL (ref 3.5–5.2)
ALK PHOS: 73 U/L (ref 39–117)
ALT: 28 U/L (ref 0–53)
AST: 21 U/L (ref 0–37)
BUN: 17 mg/dL (ref 6–23)
CO2: 27 mEq/L (ref 19–32)
Calcium: 9.8 mg/dL (ref 8.4–10.5)
Chloride: 103 mEq/L (ref 96–112)
Creat: 1.32 mg/dL (ref 0.50–1.35)
Glucose, Bld: 115 mg/dL — ABNORMAL HIGH (ref 70–99)
POTASSIUM: 4.7 meq/L (ref 3.5–5.3)
SODIUM: 140 meq/L (ref 135–145)
Total Bilirubin: 0.6 mg/dL (ref 0.2–1.2)
Total Protein: 7.3 g/dL (ref 6.0–8.3)

## 2014-01-04 LAB — POCT GLYCOSYLATED HEMOGLOBIN (HGB A1C): HEMOGLOBIN A1C: 6.5

## 2014-01-04 NOTE — Progress Notes (Signed)
  Subjective:    [name] is a 68 y.o. male who presents for follow-up of Type 2 diabetes mellitus.    Home blood sugar records: PATIENT TEST 1 TIME A DAY HIGH 160 LOW 90  Current symptoms/problems NONE Daily foot checks: Any foot concerns: NONE Last eye exam:  04/21/13 DR.GOULD   Medication compliance: Good Current diet: WATCHINGS STARCHES he has been to the nutritionist but has yet to implement any changes. Current exercise: WALKING 1 MILE A DAY Known diabetic complications: none Cardiovascular risk factors: advanced age (older than 80 for men, 28 for women), diabetes mellitus, dyslipidemia, hypertension, male gender and obesity (BMI >= 30 kg/m2)     ROS as in subjective above    Objective:    Ht 5\' 8"  (1.727 m)  Wt 232 lb (105.235 kg)  BMI 35.28 kg/m2   General appearence: alert, no distress, WD/WN  Lab Review Lab Results  Component Value Date   HGBA1C 7.5 09/06/2013   Lab Results  Component Value Date   CHOL 128 04/25/2012   HDL 45 04/25/2012   LDLCALC 66 04/25/2012   TRIG 84 04/25/2012   CHOLHDL 2.8 04/25/2012   No results found for this basenameDerl Barrow     Chemistry      Component Value Date/Time   NA 140 07/06/2012 1215   K 4.4 07/06/2012 1215   CL 101 07/06/2012 1215   CO2 25 07/06/2012 1215   BUN 18 07/06/2012 1215   CREATININE 1.23 07/06/2012 1215   CREATININE 1.65* 06/30/2012 0505      Component Value Date/Time   CALCIUM 9.5 07/06/2012 1215   ALKPHOS 172* 07/06/2012 1215   AST 22 07/06/2012 1215   ALT 60* 07/06/2012 1215   BILITOT 0.5 07/06/2012 1215        Chemistry      Component Value Date/Time   NA 140 07/06/2012 1215   K 4.4 07/06/2012 1215   CL 101 07/06/2012 1215   CO2 25 07/06/2012 1215   BUN 18 07/06/2012 1215   CREATININE 1.23 07/06/2012 1215   CREATININE 1.65* 06/30/2012 0505      Component Value Date/Time   CALCIUM 9.5 07/06/2012 1215   ALKPHOS 172* 07/06/2012 1215   AST 22 07/06/2012 1215   ALT 60* 07/06/2012 1215   BILITOT  0.5 07/06/2012 1215       Hemoglobin A1c 6.5  Assessment:  Type 2 diabetes mellitus without complication - Plan: CBC with Differential, Comprehensive metabolic panel, Lipid panel, POCT UA - Microalbumin  Need for prophylactic vaccination against Streptococcus pneumoniae (pneumococcus)  Need for prophylactic vaccination and inoculation against influenza  Hyperlipidemia LDL goal <70 - Plan: Lipid panel  Hypertension associated with diabetes - Plan: CBC with Differential, Comprehensive metabolic panel  Obesity (BMI 30-39.9) - Plan: CBC with Differential, Comprehensive metabolic panel  Encounter for long-term (current) use of medications - Plan: CBC with Differential, Comprehensive metabolic panel, Lipid panel, POCT UA - Microalbumin        Plan:    1.  Rx changes: none 2.  Education: Reviewed 'ABCs' of diabetes management (respective goals in parentheses):  A1C (<7), blood pressure (<130/80), and cholesterol (LDL <100). 3.  Compliance at present is estimated to be good. Efforts to improve compliance (if necessary) will be directed at Keep up the good work. 4. Follow up: 4 months  I complimented him on getting his A1c down. Encouraged him to become more active and use the information from the nutritionist.

## 2014-01-04 NOTE — Addendum Note (Signed)
Addended by: Randel Books on: 01/04/2014 10:39 AM   Modules accepted: Orders

## 2014-03-03 ENCOUNTER — Other Ambulatory Visit: Payer: Self-pay | Admitting: Family Medicine

## 2014-04-12 ENCOUNTER — Other Ambulatory Visit: Payer: Self-pay | Admitting: Medical

## 2014-04-12 NOTE — Telephone Encounter (Signed)
Ok to RF? 

## 2014-04-16 ENCOUNTER — Telehealth: Payer: Self-pay | Admitting: Internal Medicine

## 2014-04-16 NOTE — Telephone Encounter (Signed)
Refill request for simvastatin 40mg ,lisinopril-hctz, metformin to [facility]

## 2014-04-17 ENCOUNTER — Other Ambulatory Visit: Payer: Self-pay

## 2014-04-17 DIAGNOSIS — I10 Essential (primary) hypertension: Secondary | ICD-10-CM

## 2014-04-17 DIAGNOSIS — E1159 Type 2 diabetes mellitus with other circulatory complications: Secondary | ICD-10-CM

## 2014-04-17 DIAGNOSIS — E785 Hyperlipidemia, unspecified: Secondary | ICD-10-CM

## 2014-04-17 MED ORDER — LISINOPRIL-HYDROCHLOROTHIAZIDE 20-12.5 MG PO TABS: 1.0000 | ORAL_TABLET | Freq: Every day | ORAL | Status: DC

## 2014-04-17 MED ORDER — METFORMIN HCL 1000 MG PO TABS: 1000.0000 mg | ORAL_TABLET | Freq: Two times a day (BID) | ORAL | Status: DC

## 2014-04-17 MED ORDER — SIMVASTATIN 40 MG PO TABS: 40.0000 mg | ORAL_TABLET | Freq: Every day | ORAL | Status: DC

## 2014-04-17 NOTE — Telephone Encounter (Signed)
done

## 2014-05-07 ENCOUNTER — Ambulatory Visit: Payer: Medicare HMO | Admitting: Family Medicine

## 2014-05-10 ENCOUNTER — Encounter: Payer: Self-pay | Admitting: Family Medicine

## 2014-05-10 ENCOUNTER — Ambulatory Visit (INDEPENDENT_AMBULATORY_CARE_PROVIDER_SITE_OTHER): Payer: PPO | Admitting: Family Medicine

## 2014-05-10 VITALS — BP 132/80 | HR 65 | Wt 231.0 lb

## 2014-05-10 DIAGNOSIS — E1169 Type 2 diabetes mellitus with other specified complication: Secondary | ICD-10-CM

## 2014-05-10 DIAGNOSIS — E669 Obesity, unspecified: Secondary | ICD-10-CM

## 2014-05-10 DIAGNOSIS — E119 Type 2 diabetes mellitus without complications: Secondary | ICD-10-CM

## 2014-05-10 DIAGNOSIS — M722 Plantar fascial fibromatosis: Secondary | ICD-10-CM

## 2014-05-10 DIAGNOSIS — I152 Hypertension secondary to endocrine disorders: Secondary | ICD-10-CM

## 2014-05-10 DIAGNOSIS — H6123 Impacted cerumen, bilateral: Secondary | ICD-10-CM

## 2014-05-10 DIAGNOSIS — E1159 Type 2 diabetes mellitus with other circulatory complications: Secondary | ICD-10-CM

## 2014-05-10 DIAGNOSIS — I10 Essential (primary) hypertension: Secondary | ICD-10-CM

## 2014-05-10 DIAGNOSIS — E785 Hyperlipidemia, unspecified: Secondary | ICD-10-CM

## 2014-05-10 LAB — POCT GLYCOSYLATED HEMOGLOBIN (HGB A1C): Hemoglobin A1C: 6.8

## 2014-05-10 MED ORDER — SIMVASTATIN 40 MG PO TABS: 40.0000 mg | ORAL_TABLET | Freq: Every day | ORAL | Status: DC

## 2014-05-10 NOTE — Progress Notes (Signed)
   Subjective:    Patient ID: [name], male    DOB: [birth year], 69 y.o.   MRN: [id number]  HPI He is here for follow up of his diabetes. Reports a build up of cerumen in both ears, denies ear pain.  Denies fever, chills, chest pain, palpitations, DOE or gastrointestinal problems. Good medication compliance but requesting an alternative medication to Januvia due to cost. Has been adjusting his levemir dose based on blood sugar reading from earlier in the day. Reports daily blood sugar checks before breakfast with readings consistently in the 120s. He walks 1 mile most days and states it takes him 30 minutes. Has been eating more carbohydrates than usual. Checks feet daily and no concerns. Last eye exam was January 2015. He lives with his wife, is a former smoker and does not drink alcohol. Medications and social history reviewed as well as health maintenance and immunizations. Continues to complain of foot pain from his plantar fasciitis   Review of Systems  All other systems reviewed and are negative.      Objective:   Physical Exam Alert and in no distress. Tympanic membranes unable to visualize due to cerumen. Hearing aids bilaterally. Throat is clear. Neck is supple. Cardiac exam shows a regular rhythm. Lungs are clear to auscultation.  Extremities warm, no edema.  Hemoglobin A1C 6.8       Assessment & Plan:  Type 2 diabetes mellitus without complication - Plan: POCT glycosylated hemoglobin (Hb A1C)  Hyperlipidemia LDL goal <70 - Plan: simvastatin (ZOCOR) 40 MG tablet  Hypertension associated with diabetes  Obesity (BMI 30-39.9)  Cerumen debris on tympanic membrane, bilateral  Plantar fasciitis of right foot  He will contact his insurance provider and inquire about coverage of comparable class of diabetes medication for Januvia. Enouraged consistent dose of Levemir as prescribed and improving eating habits. Praised him for daily physical activity and encouraged him to  increase his walking pace. Try cerumenex for wax buildup in both ears for next few days and return next week if no improvement. He will make appointment for eye exam. Continue with stretching and massage for the foot trouble.

## 2014-05-10 NOTE — Patient Instructions (Addendum)
Try Cerumenex for the next several days and then come in next week if still having trouble. Recommend continuing with stretching and massage for your plantar fasciitis

## 2014-05-12 ENCOUNTER — Other Ambulatory Visit: Payer: Self-pay | Admitting: Family Medicine

## 2014-05-30 ENCOUNTER — Other Ambulatory Visit: Payer: Self-pay

## 2014-05-30 ENCOUNTER — Telehealth: Payer: Self-pay | Admitting: Family Medicine

## 2014-05-30 DIAGNOSIS — I152 Hypertension secondary to endocrine disorders: Secondary | ICD-10-CM

## 2014-05-30 DIAGNOSIS — E1159 Type 2 diabetes mellitus with other circulatory complications: Secondary | ICD-10-CM

## 2014-05-30 DIAGNOSIS — I10 Essential (primary) hypertension: Principal | ICD-10-CM

## 2014-05-30 MED ORDER — DOXAZOSIN MESYLATE 2 MG PO TABS
2.0000 mg | ORAL_TABLET | Freq: Every day | ORAL | Status: DC
Start: 2014-05-30 — End: 2020-12-03

## 2014-05-30 MED ORDER — METFORMIN HCL 1000 MG PO TABS: 1000.0000 mg | ORAL_TABLET | Freq: Two times a day (BID) | ORAL | Status: DC

## 2014-05-30 MED ORDER — LISINOPRIL-HYDROCHLOROTHIAZIDE 20-12.5 MG PO TABS: 1.0000 | ORAL_TABLET | Freq: Every day | ORAL | Status: DC

## 2014-05-30 NOTE — Telephone Encounter (Signed)
Pt called for refills on lisinopril, metformin and doxazosin. These refills go to [facility] pt pharmacy.

## 2014-05-31 ENCOUNTER — Telehealth: Payer: Self-pay | Admitting: Family Medicine

## 2014-05-31 NOTE — Telephone Encounter (Signed)
There are no equivalent drugs in the same category.

## 2014-05-31 NOTE — Telephone Encounter (Signed)
Pt states Januvia is Tier 3 & would cost him over $1000 and can't afford.  Pt brought by list of other medications covered by insurance.  Please see if there are any Tier 1 or 2 medications on the list in your folder that pt could be switched to.

## 2014-06-01 NOTE — Telephone Encounter (Signed)
Called pt and scheduled appt

## 2014-06-06 ENCOUNTER — Ambulatory Visit (INDEPENDENT_AMBULATORY_CARE_PROVIDER_SITE_OTHER): Payer: PPO | Admitting: Family Medicine

## 2014-06-06 DIAGNOSIS — E669 Obesity, unspecified: Secondary | ICD-10-CM

## 2014-06-06 DIAGNOSIS — Z9119 Patient's noncompliance with other medical treatment and regimen: Secondary | ICD-10-CM | POA: Diagnosis not present

## 2014-06-06 DIAGNOSIS — E119 Type 2 diabetes mellitus without complications: Secondary | ICD-10-CM

## 2014-06-06 DIAGNOSIS — Z91199 Patient's noncompliance with other medical treatment and regimen due to unspecified reason: Secondary | ICD-10-CM

## 2014-06-06 NOTE — Progress Notes (Signed)
   Subjective:    Patient ID: [name], male    DOB: 25-Jun-1945, 69 y.o.   MRN: [id number]  HPI He is here for consult concerning the cost of his diabetes medications. He states that Januvia is quite expensive and he is looking for an alternative medication. He is on insulin as well as metformin.   Review of Systems     Objective:   Physical Exam Alert and in no distress otherwise not examined       Assessment & Plan:  Obesity (BMI 30-39.9)  Personal history of noncompliance with medical treatment, presenting hazards to health  Type 2 diabetes mellitus without complication I explained that we have only 3 options concerning his diabetes care. Diet, exercise and medications. I explained that he has yet to make diet and exercise changes to positively affect his health in spite of my stent reminders concerning this. There are no reasonable alternatives to his medication regimen that will not cost him as much if not more money. He will therefore stop the Januvia. I again reinforced the need for him to make diet and exercise changes and changes insulin dosing to keep his morning blood sugar under 120. He will return here in several months.

## 2014-06-06 NOTE — Patient Instructions (Signed)
Stop the Januvia but you got it take better care of your diet and exercise regularly. Your morning blood sugar should be under 120

## 2014-06-12 LAB — HM DIABETES EYE EXAM

## 2014-06-13 ENCOUNTER — Encounter: Payer: Self-pay | Admitting: Internal Medicine

## 2014-06-22 ENCOUNTER — Other Ambulatory Visit: Payer: Self-pay

## 2014-06-22 ENCOUNTER — Telehealth: Payer: Self-pay | Admitting: Internal Medicine

## 2014-06-22 MED ORDER — INSULIN DETEMIR 100 UNIT/ML FLEXPEN: 24.0000 [IU] | PEN_INJECTOR | Freq: Every day | SUBCUTANEOUS | Status: DC

## 2014-06-22 NOTE — Telephone Encounter (Signed)
Refill request for levemir flextouch 100 units #15 to [facility] outpatient pharmacy

## 2014-06-22 NOTE — Telephone Encounter (Signed)
done

## 2014-07-09 ENCOUNTER — Ambulatory Visit (INDEPENDENT_AMBULATORY_CARE_PROVIDER_SITE_OTHER): Payer: PPO

## 2014-07-09 ENCOUNTER — Ambulatory Visit (INDEPENDENT_AMBULATORY_CARE_PROVIDER_SITE_OTHER): Payer: PPO | Admitting: Podiatry

## 2014-07-09 ENCOUNTER — Encounter: Payer: Self-pay | Admitting: Podiatry

## 2014-07-09 VITALS — BP 144/67 | HR 81 | Resp 18

## 2014-07-09 DIAGNOSIS — M79671 Pain in right foot: Secondary | ICD-10-CM

## 2014-07-09 DIAGNOSIS — M722 Plantar fascial fibromatosis: Secondary | ICD-10-CM | POA: Diagnosis not present

## 2014-07-09 MED ORDER — TRIAMCINOLONE ACETONIDE 10 MG/ML IJ SUSP
10.0000 mg | Freq: Once | INTRAMUSCULAR | Status: AC
  Administered 2014-07-09: 10 mg

## 2014-07-09 NOTE — Progress Notes (Signed)
   Subjective:    Patient ID: [name], male    DOB: 23-May-1945, 69 y.o.   MRN: [id number]  HPI I AM HAVING SOME RIGHT HEEL PAIN AND IT HAS BEEN GOING ON FOR ABOUT 5 MONTHS AND IS SORE AND TENDER AND HURTS ALL THE TIME AND HAD A SURGERY ON MY RIGHT HIP IN 2008    Review of Systems  All other systems reviewed and are negative.      Objective:   Physical Exam        Assessment & Plan:

## 2014-07-09 NOTE — Patient Instructions (Signed)

## 2014-07-10 ENCOUNTER — Telehealth: Payer: Self-pay | Admitting: Family Medicine

## 2014-07-10 NOTE — Progress Notes (Signed)
Subjective:     Patient ID: [name], male   DOB: 02-Nov-1945, 69 y.o.   MRN: [id number]  HPI patient states my right heel has been hurting for about 5 months and it's now at the point it hurts all the time   Review of Systems  All other systems reviewed and are negative.      Objective:   Physical Exam  Constitutional: He is oriented to person, place, and time.  Cardiovascular: Intact distal pulses.   Musculoskeletal: Normal range of motion.  Neurological: He is oriented to person, place, and time.  Skin: Skin is warm.  Nursing note and vitals reviewed.  neurovascular status intact with muscle strength adequate and range of motion subtalar midtarsal joint within normal limits. Patient's noted to have inflammation and fluid around the medial heel at the insertional point of the tendon into the calcaneus with fluid buildup noted upon palpation. Patient has good digital perfusion and is well oriented 3     Assessment:     Plantar fasciitis right with inflammation and fluid around the medial band    Plan:     H&P and x-ray reviewed and injected the right plantar fascia 3 mg Kenalog 5 mg Xylocaine and applied fascial brace with instructions on usage. Advised on supportive shoe gear usage and physical therapy and reappoint in 1 week

## 2014-07-10 NOTE — Telephone Encounter (Signed)
Needs refill on metformin and he needs 90 day supply   [facility]

## 2014-07-11 MED ORDER — METFORMIN HCL 1000 MG PO TABS: 1000.0000 mg | ORAL_TABLET | Freq: Two times a day (BID) | ORAL | Status: DC

## 2014-07-16 ENCOUNTER — Encounter: Payer: Self-pay | Admitting: Podiatry

## 2014-07-16 ENCOUNTER — Ambulatory Visit (INDEPENDENT_AMBULATORY_CARE_PROVIDER_SITE_OTHER): Payer: PPO | Admitting: Podiatry

## 2014-07-16 DIAGNOSIS — M722 Plantar fascial fibromatosis: Secondary | ICD-10-CM

## 2014-07-16 MED ORDER — TRIAMCINOLONE ACETONIDE 10 MG/ML IJ SUSP
10.0000 mg | Freq: Once | INTRAMUSCULAR | Status: AC
  Administered 2014-07-16: 10 mg

## 2014-07-17 NOTE — Progress Notes (Signed)
Subjective:     Patient ID: [name], male   DOB: 1945/04/04, 69 y.o.   MRN: [id number]  HPI patient presents stating I'm still having quite a bit of pain in my right heel and it's worse when I get up in the morning or after periods of sitting   Review of Systems     Objective:   Physical Exam Neurovascular status intact muscle strength adequate with continued discomfort plantar aspect right heel at the insertional point of the tendon into the calcaneus with inflammation and fluid buildup noted    Assessment:     Plantar fasciitis right with inflammation and fluid around the medial band at the insertion    Plan:     Instructed on physical therapy and shoe gear modifications and today reinjected the plantar fascia 3 mg Kenalog 5 mg Xylocaine and dispensed a fascial night splint with instructions on usage

## 2014-07-19 ENCOUNTER — Telehealth: Payer: Self-pay | Admitting: Internal Medicine

## 2014-07-19 MED ORDER — INSULIN PEN NEEDLE 32G X 4 MM MISC: Status: DC

## 2014-07-19 NOTE — Telephone Encounter (Signed)
Pt needed pen needles for his levemir

## 2014-08-01 ENCOUNTER — Ambulatory Visit (INDEPENDENT_AMBULATORY_CARE_PROVIDER_SITE_OTHER): Payer: PPO | Admitting: Podiatry

## 2014-08-01 ENCOUNTER — Encounter: Payer: Self-pay | Admitting: Podiatry

## 2014-08-01 VITALS — BP 148/68 | HR 60 | Resp 15

## 2014-08-01 DIAGNOSIS — M722 Plantar fascial fibromatosis: Secondary | ICD-10-CM | POA: Diagnosis not present

## 2014-08-01 MED ORDER — TRIAMCINOLONE ACETONIDE 10 MG/ML IJ SUSP
10.0000 mg | Freq: Once | INTRAMUSCULAR | Status: AC
  Administered 2014-08-01: 10 mg

## 2014-08-03 NOTE — Progress Notes (Signed)
Subjective:     Patient ID: [name], male   DOB: 03/17/1946, 69 y.o.   MRN: [id number]  HPI patient states that his right heel is improving but it is still sore in the bottom of the heel and at times is making ambulation difficult   Review of Systems     Objective:   Physical Exam Neurovascular status intact muscle strength adequate range of motion within normal limits with discomfort in the plantar heel right still present but improved from previous    Assessment:     Plantar fasciitis right that still present but improving    Plan:     Final injection administered 3 mg Kenalog 5 mg Xylocaine and advised on physical therapy shoe gear modifications and continued brace usage. Reappoint as needed

## 2014-09-07 ENCOUNTER — Telehealth: Payer: Self-pay | Admitting: Family Medicine

## 2014-09-07 NOTE — Telephone Encounter (Signed)
Pt called and stated that he had finally gotten in with the [place]. And will no longer be coming here. He states that he really appreciates all the care he has received from the office over the years and he will miss everyone. Pt stated it is strictly a financial decision. He states that all his office visits are free and all medications are $8 a piece so It's not really something he can deny. Pt was informed of office policy concerning transferring his records.

## 2014-09-11 ENCOUNTER — Ambulatory Visit: Payer: PPO | Admitting: Family Medicine

## 2014-09-17 ENCOUNTER — Other Ambulatory Visit: Payer: Self-pay

## 2015-06-14 DIAGNOSIS — H6123 Impacted cerumen, bilateral: Secondary | ICD-10-CM | POA: Diagnosis not present

## 2015-09-04 DIAGNOSIS — D122 Benign neoplasm of ascending colon: Secondary | ICD-10-CM | POA: Diagnosis not present

## 2015-09-04 DIAGNOSIS — D126 Benign neoplasm of colon, unspecified: Secondary | ICD-10-CM | POA: Diagnosis not present

## 2015-09-04 DIAGNOSIS — Z1211 Encounter for screening for malignant neoplasm of colon: Secondary | ICD-10-CM | POA: Diagnosis not present

## 2015-10-22 ENCOUNTER — Emergency Department (HOSPITAL_BASED_OUTPATIENT_CLINIC_OR_DEPARTMENT_OTHER)
Admission: EM | Admit: 2015-10-22 | Discharge: 2015-10-23 | Disposition: A | Discharge status: Home / Self Care | Payer: PPO | Attending: Emergency Medicine | Admitting: Emergency Medicine

## 2015-10-22 ENCOUNTER — Encounter (HOSPITAL_BASED_OUTPATIENT_CLINIC_OR_DEPARTMENT_OTHER): Payer: Self-pay | Admitting: Emergency Medicine

## 2015-10-22 DIAGNOSIS — N410 Acute prostatitis: Secondary | ICD-10-CM | POA: Diagnosis not present

## 2015-10-22 DIAGNOSIS — Z87891 Personal history of nicotine dependence: Secondary | ICD-10-CM | POA: Insufficient documentation

## 2015-10-22 DIAGNOSIS — R339 Retention of urine, unspecified: Secondary | ICD-10-CM | POA: Diagnosis not present

## 2015-10-22 DIAGNOSIS — Z794 Long term (current) use of insulin: Secondary | ICD-10-CM | POA: Diagnosis not present

## 2015-10-22 DIAGNOSIS — E119 Type 2 diabetes mellitus without complications: Secondary | ICD-10-CM | POA: Insufficient documentation

## 2015-10-22 DIAGNOSIS — I10 Essential (primary) hypertension: Secondary | ICD-10-CM | POA: Insufficient documentation

## 2015-10-22 DIAGNOSIS — Z7984 Long term (current) use of oral hypoglycemic drugs: Secondary | ICD-10-CM | POA: Insufficient documentation

## 2015-10-22 HISTORY — DX: Disorder of thyroid, unspecified: E07.9

## 2015-10-22 HISTORY — DX: Inflammatory disease of prostate, unspecified: N41.9

## 2015-10-22 LAB — BASIC METABOLIC PANEL
ANION GAP: 9 (ref 5–15)
BUN: 19 mg/dL (ref 6–20)
CALCIUM: 8.8 mg/dL — AB (ref 8.9–10.3)
CO2: 24 mmol/L (ref 22–32)
Chloride: 100 mmol/L — ABNORMAL LOW (ref 101–111)
Creatinine, Ser: 1.38 mg/dL — ABNORMAL HIGH (ref 0.61–1.24)
GFR calc Af Amer: 57 mL/min — ABNORMAL LOW (ref >60)
GFR, EST NON AFRICAN AMERICAN: 50 mL/min — AB (ref >60)
GLUCOSE: 244 mg/dL — AB (ref 65–99)
Potassium: 4.2 mmol/L (ref 3.5–5.1)
Sodium: 133 mmol/L — ABNORMAL LOW (ref 135–145)

## 2015-10-22 LAB — CBC WITH DIFFERENTIAL/PLATELET
BASOS ABS: 0.1 10*3/uL (ref 0.0–0.1)
BASOS PCT: 0 %
EOS ABS: 0.1 10*3/uL (ref 0.0–0.7)
Eosinophils Relative: 1 %
HCT: 35.7 % — ABNORMAL LOW (ref 39.0–52.0)
Hemoglobin: 12 g/dL — ABNORMAL LOW (ref 13.0–17.0)
Lymphocytes Relative: 6 %
Lymphs Abs: 1.4 10*3/uL (ref 0.7–4.0)
MCH: 30.6 pg (ref 26.0–34.0)
MCHC: 33.6 g/dL (ref 30.0–36.0)
MCV: 91.1 fL (ref 78.0–100.0)
MONOS PCT: 7 %
Monocytes Absolute: 1.5 10*3/uL — ABNORMAL HIGH (ref 0.1–1.0)
NEUTROS ABS: 19.6 10*3/uL — AB (ref 1.7–7.7)
NEUTROS PCT: 87 %
PLATELETS: 251 10*3/uL (ref 150–400)
RBC: 3.92 MIL/uL — ABNORMAL LOW (ref 4.22–5.81)
RDW: 12.7 % (ref 11.5–15.5)
WBC: 22.7 10*3/uL — ABNORMAL HIGH (ref 4.0–10.5)

## 2015-10-22 MED ORDER — SODIUM CHLORIDE 0.9 % IV BOLUS (SEPSIS)
500.0000 mL | Freq: Once | INTRAVENOUS | Status: AC
  Administered 2015-10-22: 500 mL via INTRAVENOUS

## 2015-10-22 MED ORDER — ACETAMINOPHEN 325 MG PO TABS
650.0000 mg | ORAL_TABLET | Freq: Once | ORAL | Status: AC
  Administered 2015-10-22: 650 mg via ORAL
  Filled 2015-10-22: qty 2

## 2015-10-22 NOTE — ED Provider Notes (Addendum)
[name] Provider Note   CSN: [id number] Arrival date & time: 10/22/15  2210  First Provider Contact:  First MD Initiated Contact with Patient 10/22/15 2303    By signing my name below, I, [name], attest that this documentation has been prepared under the direction and in the presence of Shanon Rosser, MD . Electronically Signed: Evelene Chase, Scribe. 10/22/2015. 2:47 AM.  History   Chief Complaint Chief Complaint  Patient presents with  . Urinary Retention    The history is provided by the patient. No language interpreter was used.    HPI Comments:  [name] is a 70 y.o. male with a history of DM, prostatitis and BPH, who presents to the Emergency Department complaining of the sensation of urinary retention which began ~1500/1600 today. Pt states he feels the urge to urinate but is unable to. Bladder scan done in the ED showed minimal urine present in the bladder. He reports associated fever with TMAX of 101 and chills. He denies nausea, vomiting, diarrhea, CP and SOB. Pt has a h/o prostatitis with similar symptoms in the past; last episode was in 2010.  Past Medical History:  Diagnosis Date  . Allergy   . Arthritis   . BPH (benign prostatic hyperplasia)   . Diabetes mellitus   . Dyslipidemia   . ED (erectile dysfunction)   . History of colonic polyps   . Hypertension   . Obesity   . Prostatitis   . Thyroid disease     Patient Active Problem List   Diagnosis Date Noted  . Anemia of chronic disease 06/30/2012  . Personal history of noncompliance with medical treatment, presenting hazards to health 04/28/2011  . Hyperlipidemia LDL goal <70 09/05/2010  . Hypertension associated with diabetes ([place]) 09/05/2010  . Obesity (BMI 30-39.9) 09/05/2010    Past Surgical History:  Procedure Laterality Date  . BACK SURGERY  2009   OLIN  . COLONOSCOPY  2006   HAYES  . HERNIA REPAIR  1996  . REVISION TOTAL HIP ARTHROPLASTY  11/07   RIGHT OLIN  .  TONSILLECTOMY     Pt was 70 yrs old     Home Medications    Prior to Admission medications   Medication Sig Start Date End Date Taking? Authorizing Provider  aspirin 325 MG tablet Take 325 mg by mouth daily.      Historical Provider, MD  cholecalciferol (VITAMIN D) 1000 UNITS tablet Take 1,000 Units by mouth daily.    Historical Provider, MD  ciprofloxacin (CIPRO) 500 MG tablet Take 1 tablet (500 mg total) by mouth 2 (two) times daily. 10/23/15   Ranulfo Kall, MD  doxazosin (CARDURA) 2 MG tablet Take 1 tablet (2 mg total) by mouth daily. 05/30/14   Denita Lung, MD  glipiZIDE (GLUCOTROL XL) 5 MG 24 hr tablet TAKE 1 TABLET BY MOUTH TWICE DAILY BEFORE A MEAL 09/06/13   Denita Lung, MD  glucosamine-chondroitin 500-400 MG tablet Take 1 tablet by mouth 3 (three) times daily.      Historical Provider, MD  insulin detemir (LEVEMIR) 100 UNIT/ML injection Inject 0.24 mLs (24 Units total) into the skin at bedtime. 05/08/13   Denita Lung, MD  Insulin Detemir (LEVEMIR) 100 UNIT/ML Pen Inject 24 Units into the skin daily at 10 pm. 06/22/14   Denita Lung, MD  Insulin Pen Needle (BD PEN NEEDLE NANO U/F) 32G X 4 MM MISC This is for levemir pen 07/19/14   Denita Lung, MD  lisinopril-hydrochlorothiazide (ZESTORETIC) 20-12.5 MG per tablet Take 1 tablet by mouth daily. 05/30/14   Denita Lung, MD  metFORMIN (GLUCOPHAGE) 1000 MG tablet Take 1 tablet (1,000 mg total) by mouth 2 (two) times daily. 07/11/14   Denita Lung, MD  Multiple Vitamins-Minerals (MULTIVITAMIN WITH MINERALS) tablet Take 1 tablet by mouth daily.      Historical Provider, MD  simvastatin (ZOCOR) 40 MG tablet Take 1 tablet (40 mg total) by mouth at bedtime. 05/10/14   Denita Lung, MD  simvastatin (ZOCOR) 40 MG tablet TAKE 1 TABLET BY MOUTH EVERY NIGHT AT BEDTIME 05/14/14   Denita Lung, MD    Family History History reviewed. No pertinent family history.  Social History Social History  Substance Use Topics  . Smoking status: Former  Smoker    Quit date: 03/23/1986  . Smokeless tobacco: Never Used  . Alcohol use No     Allergies   Sulfa antibiotics and Morphine and related   Review of Systems Review of Systems  10 systems reviewed and all are negative for acute change except as noted in the HPI.  Physical Exam Updated Vital Signs BP 139/68 (BP Location: Left Arm)   Pulse 85   Temp 100.6 F (38.1 C) (Oral)   Resp 18   Ht 5\' 8"  (1.727 m)   Wt 237 lb (107.5 kg)   SpO2 94%   BMI 36.04 kg/m   Physical Exam General: Well-developed, well-nourished male in no acute distress; appearance consistent with age of record HENT: normocephalic; atraumatic Eyes: pupils equal, round and reactive to light; extraocular muscles intact Neck: supple Heart: regular rate and rhythm Lungs: clear to auscultation bilaterally Abdomen: soft; nondistended; nontender; no masses or hepatosplenomegaly; bowel sounds present GU: prostate is tender  Extremities: No deformity; full range of motion; pulses normal; trace edema of BLE  Neurologic: Awake, alert and oriented; motor function intact in all extremities and symmetric; no facial droop Skin: Warm and dry Psychiatric: Normal mood and affect    ED Treatments / Results   Nursing notes and vitals signs, including pulse oximetry, reviewed.  Summary of this visit's results, reviewed by myself:  Labs:  Results for orders placed or performed during the hospital encounter of 10/22/15 (from the past 24 hour(s))  CBC with Differential/Platelet     Status: Abnormal   Collection Time: 10/22/15 11:25 PM  Result Value Ref Range   WBC 22.7 (H) 4.0 - 10.5 K/uL   RBC 3.92 (L) 4.22 - 5.81 MIL/uL   Hemoglobin 12.0 (L) 13.0 - 17.0 g/dL   HCT 35.7 (L) 39.0 - 52.0 %   MCV 91.1 78.0 - 100.0 fL   MCH 30.6 26.0 - 34.0 pg   MCHC 33.6 30.0 - 36.0 g/dL   RDW 12.7 11.5 - 15.5 %   Platelets 251 150 - 400 K/uL   Neutrophils Relative % 87 %   Neutro Abs 19.6 (H) 1.7 - 7.7 K/uL   Lymphocytes  Relative 6 %   Lymphs Abs 1.4 0.7 - 4.0 K/uL   Monocytes Relative 7 %   Monocytes Absolute 1.5 (H) 0.1 - 1.0 K/uL   Eosinophils Relative 1 %   Eosinophils Absolute 0.1 0.0 - 0.7 K/uL   Basophils Relative 0 %   Basophils Absolute 0.1 0.0 - 0.1 K/uL   WBC Morphology WHITE COUNT CONFIRMED ON SMEAR    Smear Review PLATELET COUNT CONFIRMED BY SMEAR   Basic metabolic panel     Status: Abnormal   Collection Time:  10/22/15 11:25 PM  Result Value Ref Range   Sodium 133 (L) 135 - 145 mmol/L   Potassium 4.2 3.5 - 5.1 mmol/L   Chloride 100 (L) 101 - 111 mmol/L   CO2 24 22 - 32 mmol/L   Glucose, Bld 244 (H) 65 - 99 mg/dL   BUN 19 6 - 20 mg/dL   Creatinine, Ser 1.38 (H) 0.61 - 1.24 mg/dL   Calcium 8.8 (L) 8.9 - 10.3 mg/dL   GFR calc non Af Amer 50 (L) >60 mL/min   GFR calc Af Amer 57 (L) >60 mL/min   Anion gap 9 5 - 15  Urinalysis, Routine w reflex microscopic (not at [facility])     Status: Abnormal   Collection Time: 10/23/15 12:34 AM  Result Value Ref Range   Color, Urine YELLOW YELLOW   APPearance CLOUDY (A) CLEAR   Specific Gravity, Urine 1.023 1.005 - 1.030   pH 5.5 5.0 - 8.0   Glucose, UA 250 (A) NEGATIVE mg/dL   Hgb urine dipstick SMALL (A) NEGATIVE   Bilirubin Urine NEGATIVE NEGATIVE   Ketones, ur 15 (A) NEGATIVE mg/dL   Protein, ur NEGATIVE NEGATIVE mg/dL   Nitrite NEGATIVE NEGATIVE   Leukocytes, UA LARGE (A) NEGATIVE  Urine microscopic-add on     Status: Abnormal   Collection Time: 10/23/15 12:34 AM  Result Value Ref Range   Squamous Epithelial / LPF 0-5 (A) NONE SEEN   WBC, UA TOO NUMEROUS TO COUNT 0 - 5 WBC/hpf   RBC / HPF 0-5 0 - 5 RBC/hpf   Bacteria, UA FEW (A) NONE SEEN    Procedures (including critical care time)  Final Clinical Impressions(s) / ED Diagnoses  12:43 AM Patient able to void on his own after IV fluid bolus.  2:48 AM Patient advised that Cipro and glipizide taken together may cause an increased risk of hypoglycemia. He was advised to monitor his blood  sugar and seek medical attention should he have signs of hypoglycemia.He was advised to contact his PCP as he may need his dose of glipizide adjusted. He will also contact his urologist at [facility] today to schedule follow-up.  Final diagnoses:  Acute prostatitis   I personally performed the services described in this documentation, which was scribed in my presence. The recorded information has been reviewed and is accurate.    Shanon Rosser, MD 10/23/15 [id number]    Shanon Rosser, MD 10/23/15 [place], MD 10/23/15 [phone]

## 2015-10-23 ENCOUNTER — Encounter (HOSPITAL_BASED_OUTPATIENT_CLINIC_OR_DEPARTMENT_OTHER): Payer: Self-pay | Admitting: Emergency Medicine

## 2015-10-23 LAB — URINE MICROSCOPIC-ADD ON

## 2015-10-23 LAB — URINALYSIS, ROUTINE W REFLEX MICROSCOPIC
BILIRUBIN URINE: NEGATIVE
GLUCOSE, UA: 250 mg/dL — AB
KETONES UR: 15 mg/dL — AB
Nitrite: NEGATIVE
PH: 5.5 (ref 5.0–8.0)
Protein, ur: NEGATIVE mg/dL
Specific Gravity, Urine: 1.023 (ref 1.005–1.030)

## 2015-10-23 MED ORDER — CIPROFLOXACIN IN D5W 400 MG/200ML IV SOLN
400.0000 mg | Freq: Once | INTRAVENOUS | Status: AC
  Administered 2015-10-23: 400 mg via INTRAVENOUS
  Filled 2015-10-23: qty 200

## 2015-10-23 MED ORDER — CIPROFLOXACIN HCL 500 MG PO TABS: 500.0000 mg | ORAL_TABLET | Freq: Two times a day (BID) | ORAL | 0 refills | Status: DC

## 2015-10-23 NOTE — ED Notes (Signed)
Pt voided in bathroom prior to leaving.

## 2015-10-23 NOTE — ED Notes (Signed)
Pt warned that cipro and glipizide combination can cause blood sugar to drop low. Pt advised to keep check blood sugar at home and contact PCP if hypoglycemia occurs. Pt and wife verbalize understanding.

## 2015-10-23 NOTE — ED Notes (Signed)
MD at bedside. 

## 2015-10-25 LAB — URINE CULTURE

## 2015-10-26 ENCOUNTER — Telehealth (HOSPITAL_BASED_OUTPATIENT_CLINIC_OR_DEPARTMENT_OTHER): Payer: Self-pay

## 2015-10-26 NOTE — Telephone Encounter (Signed)
Post ED Visit - Positive Culture Follow-up  Culture report reviewed by antimicrobial stewardship pharmacist:  []  Elenor Quinones, Pharm.D. []  Heide Guile, Pharm.D., BCPS [x]  Parks Neptune, Pharm.D. []  Alycia Rossetti, Pharm.D., BCPS []  [place], Pharm.D., BCPS, AAHIVP []  Legrand Como, Pharm.D., BCPS, AAHIVP []  Milus Glazier, Pharm.D. []  Stephens November, Pharm.D.  Positive urine culture Treated with Ciprofloxacin HCL, organism sensitive to the same and no further patient follow-up is required at this time.  Genia Del 10/26/2015, 8:51 AM

## 2015-11-14 DIAGNOSIS — N3000 Acute cystitis without hematuria: Secondary | ICD-10-CM | POA: Diagnosis not present

## 2015-11-14 DIAGNOSIS — R3912 Poor urinary stream: Secondary | ICD-10-CM | POA: Diagnosis not present

## 2015-12-10 DIAGNOSIS — H6121 Impacted cerumen, right ear: Secondary | ICD-10-CM | POA: Diagnosis not present

## 2015-12-10 DIAGNOSIS — H60331 Swimmer's ear, right ear: Secondary | ICD-10-CM | POA: Diagnosis not present

## 2016-01-31 DIAGNOSIS — J209 Acute bronchitis, unspecified: Secondary | ICD-10-CM | POA: Diagnosis not present

## 2016-02-04 ENCOUNTER — Emergency Department (HOSPITAL_BASED_OUTPATIENT_CLINIC_OR_DEPARTMENT_OTHER): Payer: PPO

## 2016-02-04 ENCOUNTER — Emergency Department (HOSPITAL_BASED_OUTPATIENT_CLINIC_OR_DEPARTMENT_OTHER)
Admission: EM | Admit: 2016-02-04 | Discharge: 2016-02-04 | Disposition: A | Discharge status: Home / Self Care | Payer: PPO | Attending: Emergency Medicine | Admitting: Emergency Medicine

## 2016-02-04 ENCOUNTER — Encounter (HOSPITAL_BASED_OUTPATIENT_CLINIC_OR_DEPARTMENT_OTHER): Payer: Self-pay

## 2016-02-04 DIAGNOSIS — S6992XA Unspecified injury of left wrist, hand and finger(s), initial encounter: Secondary | ICD-10-CM | POA: Diagnosis not present

## 2016-02-04 DIAGNOSIS — Z7982 Long term (current) use of aspirin: Secondary | ICD-10-CM | POA: Diagnosis not present

## 2016-02-04 DIAGNOSIS — Z794 Long term (current) use of insulin: Secondary | ICD-10-CM | POA: Diagnosis not present

## 2016-02-04 DIAGNOSIS — S20212A Contusion of left front wall of thorax, initial encounter: Secondary | ICD-10-CM | POA: Diagnosis not present

## 2016-02-04 DIAGNOSIS — S51012A Laceration without foreign body of left elbow, initial encounter: Secondary | ICD-10-CM | POA: Diagnosis not present

## 2016-02-04 DIAGNOSIS — S299XXA Unspecified injury of thorax, initial encounter: Secondary | ICD-10-CM | POA: Diagnosis not present

## 2016-02-04 DIAGNOSIS — Z23 Encounter for immunization: Secondary | ICD-10-CM | POA: Insufficient documentation

## 2016-02-04 DIAGNOSIS — Y999 Unspecified external cause status: Secondary | ICD-10-CM | POA: Insufficient documentation

## 2016-02-04 DIAGNOSIS — I10 Essential (primary) hypertension: Secondary | ICD-10-CM | POA: Diagnosis not present

## 2016-02-04 DIAGNOSIS — W01198A Fall on same level from slipping, tripping and stumbling with subsequent striking against other object, initial encounter: Secondary | ICD-10-CM | POA: Insufficient documentation

## 2016-02-04 DIAGNOSIS — Y929 Unspecified place or not applicable: Secondary | ICD-10-CM | POA: Insufficient documentation

## 2016-02-04 DIAGNOSIS — J40 Bronchitis, not specified as acute or chronic: Secondary | ICD-10-CM | POA: Diagnosis not present

## 2016-02-04 DIAGNOSIS — E119 Type 2 diabetes mellitus without complications: Secondary | ICD-10-CM | POA: Insufficient documentation

## 2016-02-04 DIAGNOSIS — W19XXXA Unspecified fall, initial encounter: Secondary | ICD-10-CM

## 2016-02-04 DIAGNOSIS — S61412A Laceration without foreign body of left hand, initial encounter: Secondary | ICD-10-CM | POA: Insufficient documentation

## 2016-02-04 DIAGNOSIS — Z87891 Personal history of nicotine dependence: Secondary | ICD-10-CM | POA: Insufficient documentation

## 2016-02-04 DIAGNOSIS — M79642 Pain in left hand: Secondary | ICD-10-CM | POA: Diagnosis not present

## 2016-02-04 DIAGNOSIS — S59902A Unspecified injury of left elbow, initial encounter: Secondary | ICD-10-CM | POA: Diagnosis not present

## 2016-02-04 DIAGNOSIS — M25522 Pain in left elbow: Secondary | ICD-10-CM | POA: Diagnosis not present

## 2016-02-04 DIAGNOSIS — Y939 Activity, unspecified: Secondary | ICD-10-CM | POA: Insufficient documentation

## 2016-02-04 MED ORDER — POTASSIUM CHLORIDE CRYS ER 20 MEQ PO TBCR: 20.0000 meq | EXTENDED_RELEASE_TABLET | Freq: Every day | ORAL | 0 refills | Status: DC

## 2016-02-04 MED ORDER — TETANUS-DIPHTH-ACELL PERTUSSIS 5-2.5-18.5 LF-MCG/0.5 IM SUSP
0.5000 mL | Freq: Once | INTRAMUSCULAR | Status: AC
  Administered 2016-02-04: 0.5 mL via INTRAMUSCULAR
  Filled 2016-02-04: qty 0.5

## 2016-02-04 MED ORDER — ALBUTEROL SULFATE (2.5 MG/3ML) 0.083% IN NEBU
5.0000 mg | INHALATION_SOLUTION | Freq: Once | RESPIRATORY_TRACT | Status: AC
Start: 2016-02-04 — End: 2016-02-04
  Administered 2016-02-04: 5 mg via RESPIRATORY_TRACT
  Filled 2016-02-04: qty 6

## 2016-02-04 NOTE — ED Notes (Signed)
Pt returned from xray

## 2016-02-04 NOTE — ED Provider Notes (Signed)
[facility] DEPT [facility] Provider Note   CSN: [id number] Arrival date & time: 02/04/16  2039  By signing my name below, I, [name], attest that this documentation has been prepared under the direction and in the presence of Malvin Johns, MD. Electronically Signed: Irene Chase, ED Scribe. 02/04/16. 10:40 PM.  History   Chief Complaint Chief Complaint  Patient presents with  . Fall    abrasions and bruising    The history is provided by the patient. No language interpreter was used.  HPI Comments: [name] is a 70 y.o. Male with a hx of HTN and DM who presents to the Emergency Department complaining of a fall occurring PTA. Pt says that he tripped on a curb, fell and landed on his left side on concrete. He is complaining of left ribcage pain, with abrasions to the left hand and elbow. He says he hit his chin, but no other part of his head. He has worsening pain with coughing. He is on aspirin 81 mg. He says that his CBG levels have been elevated since beginning steroids. He denies abdominal pain, back pain, neck pain, dizziness, lightheadedness, syncope, numbness, or weakness. Pt is not utd on tdap.   Past Medical History:  Diagnosis Date  . Allergy   . Arthritis   . BPH (benign prostatic hyperplasia)   . Diabetes mellitus   . Dyslipidemia   . ED (erectile dysfunction)   . History of colonic polyps   . Hypertension   . Obesity   . Prostatitis   . Thyroid disease     Patient Active Problem List   Diagnosis Date Noted  . Anemia of chronic disease 06/30/2012  . Personal history of noncompliance with medical treatment, presenting hazards to health 04/28/2011  . Hyperlipidemia LDL goal <70 09/05/2010  . Hypertension associated with diabetes ([place]) 09/05/2010  . Obesity (BMI 30-39.9) 09/05/2010    Past Surgical History:  Procedure Laterality Date  . BACK SURGERY  2009   OLIN  . COLONOSCOPY  2006   HAYES  . HERNIA REPAIR  1996  . REVISION TOTAL HIP  ARTHROPLASTY  11/07   RIGHT OLIN  . TONSILLECTOMY     Pt was 70 yrs old     Home Medications    Prior to Admission medications   Medication Sig Start Date End Date Taking? Authorizing Provider  aspirin 325 MG tablet Take 325 mg by mouth daily.      Historical Provider, MD  cholecalciferol (VITAMIN D) 1000 UNITS tablet Take 1,000 Units by mouth daily.    Historical Provider, MD  ciprofloxacin (CIPRO) 500 MG tablet Take 1 tablet (500 mg total) by mouth 2 (two) times daily. 10/23/15   John Molpus, MD  doxazosin (CARDURA) 2 MG tablet Take 1 tablet (2 mg total) by mouth daily. 05/30/14   Denita Lung, MD  glipiZIDE (GLUCOTROL XL) 5 MG 24 hr tablet TAKE 1 TABLET BY MOUTH TWICE DAILY BEFORE A MEAL 09/06/13   Denita Lung, MD  glucosamine-chondroitin 500-400 MG tablet Take 1 tablet by mouth 3 (three) times daily.      Historical Provider, MD  insulin detemir (LEVEMIR) 100 UNIT/ML injection Inject 0.24 mLs (24 Units total) into the skin at bedtime. 05/08/13   Denita Lung, MD  Insulin Detemir (LEVEMIR) 100 UNIT/ML Pen Inject 24 Units into the skin daily at 10 pm. 06/22/14   Denita Lung, MD  Insulin Pen Needle (BD PEN NEEDLE NANO U/F) 32G X 4 MM MISC  This is for levemir pen 07/19/14   Denita Lung, MD  lisinopril-hydrochlorothiazide (ZESTORETIC) 20-12.5 MG per tablet Take 1 tablet by mouth daily. 05/30/14   Denita Lung, MD  metFORMIN (GLUCOPHAGE) 1000 MG tablet Take 1 tablet (1,000 mg total) by mouth 2 (two) times daily. 07/11/14   Denita Lung, MD  Multiple Vitamins-Minerals (MULTIVITAMIN WITH MINERALS) tablet Take 1 tablet by mouth daily.      Historical Provider, MD  simvastatin (ZOCOR) 40 MG tablet Take 1 tablet (40 mg total) by mouth at bedtime. 05/10/14   Denita Lung, MD  simvastatin (ZOCOR) 40 MG tablet TAKE 1 TABLET BY MOUTH EVERY NIGHT AT BEDTIME 05/14/14   Denita Lung, MD    Family History No family history on file.  Social History Social History  Substance Use Topics  .  Smoking status: Former Smoker    Quit date: 03/23/1986  . Smokeless tobacco: Never Used  . Alcohol use No     Allergies   Sulfa antibiotics and Morphine and related  Review of Systems Review of Systems  Constitutional: Negative for fever.  HENT: Positive for congestion.   Respiratory: Positive for cough and wheezing.   Cardiovascular: Positive for chest pain (left ribcage pain).  Gastrointestinal: Negative for nausea and vomiting.  Musculoskeletal: Positive for arthralgias. Negative for back pain and neck pain.  Skin: Negative for wound.  Neurological: Negative for syncope, weakness, numbness and headaches.   Physical Exam Updated Vital Signs BP 164/85 (BP Location: Right Arm)   Pulse 73   Temp 97.9 F (36.6 C) (Oral)   Resp 18   Ht 5\' 8"  (1.727 m)   Wt 235 lb (106.6 kg)   SpO2 93%   BMI 35.73 kg/m   Physical Exam  Constitutional: He is oriented to person, place, and time. He appears well-developed and well-nourished.  HENT:  Head: Normocephalic and atraumatic.  Neck: Normal range of motion. Neck supple.  Cardiovascular: Normal rate.   Pulmonary/Chest: Effort normal. He has wheezes.  Mild tenderness to the left lower anterior ribs. No crepitus is noted. No evidence of external trauma. No abdominal tenderness.  Abdominal: There is no tenderness.  Musculoskeletal: He exhibits tenderness. He exhibits no edema.  Patient has a small skin tear to his left elbow. There is no underlying bony tenderness. There is no pain to the shoulder. He also has several skin tears across the dorsum of his left hand. There is no underlying bony tenderness. He's neurologically intact distally. There is no other pain on palpation or range of motion extremities. No pain to the cervical thoracic or lumbosacral spine  Neurological: He is alert and oriented to person, place, and time.  Skin: Skin is warm and dry.  Psychiatric: He has a normal mood and affect.   ED Treatments / Results  DIAGNOSTIC  STUDIES: Oxygen Saturation is 93% on RA, low by my interpretation.    COORDINATION OF CARE: 10:37 PM-Discussed treatment plan which includes x-rays, updated tdap, and breathing treatment with pt at bedside and pt agreed to plan.    Labs (all labs ordered are listed, but only abnormal results are displayed) Labs Reviewed - No data to display  EKG  EKG Interpretation None       Radiology Dg Ribs Unilateral W/chest Left  Result Date: 02/04/2016 CLINICAL DATA:  [place] tonight. EXAM: LEFT RIBS AND CHEST - 3+ VIEW COMPARISON:  06/25/2012, 06/27/2012. FINDINGS: No fracture or other bone lesions are seen involving the ribs. There is no  evidence of pneumothorax or pleural effusion. Both lungs are clear. Mild unchanged cardiomegaly. Normal mediastinal and hilar contours. IMPRESSION: Negative. Electronically Signed   By: Andreas Newport M.D.   On: 02/04/2016 22:02   Dg Elbow Complete Left  Result Date: 02/04/2016 CLINICAL DATA:  Tripped and fell on concrete. No loss of consciousness. EXAM: LEFT HAND - COMPLETE 3+ VIEW; LEFT ELBOW - COMPLETE 3+ VIEW COMPARISON:  None. FINDINGS: LEFT elbow: Slight cortical irregularity of the medial olecranon. No dislocation. There is no evidence of arthropathy or other focal bone abnormality. Soft tissues are nonsuspicious. LEFT hand: No acute fracture deformity or dislocation. Severe first carpometacarpal joint space narrowing, subchondral cysts and marginal spurring compatible with osteoarthrosis. Osteopenia without destructive bony lesions. Dorsal wrist soft tissue swelling without subcutaneous gas or radiopaque foreign bodies. IMPRESSION: LEFT ELBOW: Cortical irregularity of the medial olecranon may be degenerative or posttraumatic, recommend correlation with point tenderness. No dislocation. LEFT HAND: No acute fracture deformity or dislocation. Severe first carpometacarpal osteoarthrosis. Electronically Signed   By: Elon Alas M.D.   On: 02/04/2016 22:06     Dg Hand Complete Left  Result Date: 02/04/2016 CLINICAL DATA:  Tripped and fell on concrete. No loss of consciousness. EXAM: LEFT HAND - COMPLETE 3+ VIEW; LEFT ELBOW - COMPLETE 3+ VIEW COMPARISON:  None. FINDINGS: LEFT elbow: Slight cortical irregularity of the medial olecranon. No dislocation. There is no evidence of arthropathy or other focal bone abnormality. Soft tissues are nonsuspicious. LEFT hand: No acute fracture deformity or dislocation. Severe first carpometacarpal joint space narrowing, subchondral cysts and marginal spurring compatible with osteoarthrosis. Osteopenia without destructive bony lesions. Dorsal wrist soft tissue swelling without subcutaneous gas or radiopaque foreign bodies. IMPRESSION: LEFT ELBOW: Cortical irregularity of the medial olecranon may be degenerative or posttraumatic, recommend correlation with point tenderness. No dislocation. LEFT HAND: No acute fracture deformity or dislocation. Severe first carpometacarpal osteoarthrosis. Electronically Signed   By: Elon Alas M.D.   On: 02/04/2016 22:06    Procedures Procedures (including critical care time)  Medications Ordered in ED Medications  Tdap (BOOSTRIX) injection 0.5 mL (0.5 mLs Intramuscular Given 02/04/16 2252)  albuterol (PROVENTIL) (2.5 MG/3ML) 0.083% nebulizer solution 5 mg (5 mg Nebulization Given 02/04/16 2250)     Initial Impression / Assessment and Plan / ED Course  I have reviewed the triage vital signs and the nursing notes.  Pertinent labs & imaging results that were available during my care of the patient were reviewed by me and considered in my medical decision making (see chart for details).  Clinical Course     Patient presents after a mechanical fall. There is no fractures evident. He has skin tears that don't appear to be amenable to suturing. These wounds were cleaned and irrigated. Nonstick dressings were applied. Patient was given wound care instructions. He doesn't  currently have a primary care physician. He was advised to return here if he has any worsening symptoms or signs of infection.  He also has some wheezing noted on exam. He states he's currently being treated for bronchitis. He finished a course of antibiotics. He also finished a dose of steroids. He has an inhaler to use at home. He was given a nebulizer treatment here. He is maintaining normal oxygen saturations. There is no evidence of pneumonia. He denies any shortness of breath. He was encouraged to return if he has any worsening symptoms.  Final Clinical Impressions(s) / ED Diagnoses   Final diagnoses:  Fall, initial encounter  Skin tear of  left hand without complication, initial encounter  Skin tear of left elbow without complication, initial encounter  Bronchitis  I personally performed the services described in this documentation, which was scribed in my presence.  The recorded information has been reviewed and considered.   New Prescriptions Current Discharge Medication List       Malvin Johns, MD 02/04/16 [phone]

## 2016-02-04 NOTE — ED Notes (Signed)
Patient transported to X-ray 

## 2016-02-04 NOTE — ED Triage Notes (Signed)
Pt states tripped and fell on concrete; c/o pain to lt rib, abrasions to lt hand, elbow and bruising; denies loc

## 2016-02-04 NOTE — ED Notes (Signed)
Pt verbalizes understanding of d/c instructions and denies any further needs at this time. 

## 2016-02-07 ENCOUNTER — Emergency Department (HOSPITAL_BASED_OUTPATIENT_CLINIC_OR_DEPARTMENT_OTHER)
Admission: EM | Admit: 2016-02-07 | Discharge: 2016-02-07 | Disposition: A | Discharge status: Home / Self Care | Payer: PPO | Attending: Emergency Medicine | Admitting: Emergency Medicine

## 2016-02-07 ENCOUNTER — Encounter (HOSPITAL_BASED_OUTPATIENT_CLINIC_OR_DEPARTMENT_OTHER): Payer: Self-pay | Admitting: *Deleted

## 2016-02-07 DIAGNOSIS — Z5189 Encounter for other specified aftercare: Secondary | ICD-10-CM

## 2016-02-07 DIAGNOSIS — Z794 Long term (current) use of insulin: Secondary | ICD-10-CM | POA: Insufficient documentation

## 2016-02-07 DIAGNOSIS — E119 Type 2 diabetes mellitus without complications: Secondary | ICD-10-CM | POA: Diagnosis not present

## 2016-02-07 DIAGNOSIS — Z4800 Encounter for change or removal of nonsurgical wound dressing: Secondary | ICD-10-CM | POA: Diagnosis not present

## 2016-02-07 DIAGNOSIS — Z7982 Long term (current) use of aspirin: Secondary | ICD-10-CM | POA: Diagnosis not present

## 2016-02-07 DIAGNOSIS — J209 Acute bronchitis, unspecified: Secondary | ICD-10-CM

## 2016-02-07 DIAGNOSIS — I10 Essential (primary) hypertension: Secondary | ICD-10-CM | POA: Diagnosis not present

## 2016-02-07 DIAGNOSIS — R0781 Pleurodynia: Secondary | ICD-10-CM | POA: Diagnosis not present

## 2016-02-07 DIAGNOSIS — Z87891 Personal history of nicotine dependence: Secondary | ICD-10-CM | POA: Insufficient documentation

## 2016-02-07 MED ORDER — HYDROCODONE-ACETAMINOPHEN 5-325 MG PO TABS: 1.0000 | ORAL_TABLET | Freq: Four times a day (QID) | ORAL | 0 refills | Status: DC | PRN

## 2016-02-07 MED ORDER — DOXYCYCLINE HYCLATE 100 MG PO CAPS: 100.0000 mg | ORAL_CAPSULE | Freq: Two times a day (BID) | ORAL | 0 refills | Status: DC

## 2016-02-07 MED ORDER — IPRATROPIUM-ALBUTEROL 0.5-2.5 (3) MG/3ML IN SOLN
3.0000 mL | Freq: Four times a day (QID) | RESPIRATORY_TRACT | Status: DC
  Administered 2016-02-07: 3 mL via RESPIRATORY_TRACT
  Filled 2016-02-07: qty 3

## 2016-02-07 MED ORDER — PREDNISONE 10 MG PO TABS: 20.0000 mg | ORAL_TABLET | Freq: Two times a day (BID) | ORAL | 0 refills | Status: DC

## 2016-02-07 MED FILL — DOXYCYCLINE HYCLATE 100 MG: 100 | 7 days supply | Qty: 14 | Fill #0

## 2016-02-07 MED FILL — predniSONE 10 MG TABS: 10 | 5 days supply | Qty: 20 | Fill #0

## 2016-02-07 MED FILL — HYDROCODON-APAP 5-325: 5-325 | 2 days supply | Qty: 15 | Fill #0

## 2016-02-07 NOTE — Discharge Instructions (Signed)
Doxycycline and prednisone as prescribed.  Hydrocodone as prescribed as needed for pain.  Follow-up with your primary Dr. if you are not improving in the next week, and return to the ER if your symptoms worsen or change.

## 2016-02-07 NOTE — ED Provider Notes (Signed)
[facility] Provider Note   CSN: [id number] Arrival date & time: 02/07/16  1225     History   Chief Complaint No chief complaint on file.   HPI [name] is a 70 y.o. male.  Patient is a 70 year old male with history of diabetes and hypertension. He presents for evaluation of left hand wound check. He apparently fell several days ago and caused a sizable skin tear to the dorsum of his left hand. This was dressed and he was instructed to return in 3 days for a wound check.  He is also complaining of pain in his ribs from his fall. He is reporting a productive cough which is unrelieved with Zithromax and the Combivent inhaler which was given to him at urgent care. He denies any fevers or chills. He denies any new injury or trauma.   The history is provided by the patient.    Past Medical History:  Diagnosis Date  . Allergy   . Arthritis   . BPH (benign prostatic hyperplasia)   . Diabetes mellitus   . Dyslipidemia   . ED (erectile dysfunction)   . History of colonic polyps   . Hypertension   . Obesity   . Prostatitis   . Thyroid disease     Patient Active Problem List   Diagnosis Date Noted  . Anemia of chronic disease 06/30/2012  . Personal history of noncompliance with medical treatment, presenting hazards to health 04/28/2011  . Hyperlipidemia LDL goal <70 09/05/2010  . Hypertension associated with diabetes ([place]) 09/05/2010  . Obesity (BMI 30-39.9) 09/05/2010    Past Surgical History:  Procedure Laterality Date  . BACK SURGERY  2009   OLIN  . COLONOSCOPY  2006   HAYES  . HERNIA REPAIR  1996  . REVISION TOTAL HIP ARTHROPLASTY  11/07   RIGHT OLIN  . TONSILLECTOMY     Pt was 70 yrs old       Home Medications    Prior to Admission medications   Medication Sig Start Date End Date Taking? Authorizing Provider  aspirin 325 MG tablet Take 325 mg by mouth daily.      Historical Provider, MD  cholecalciferol (VITAMIN D) 1000 UNITS tablet  Take 1,000 Units by mouth daily.    Historical Provider, MD  ciprofloxacin (CIPRO) 500 MG tablet Take 1 tablet (500 mg total) by mouth 2 (two) times daily. 10/23/15   John Molpus, MD  doxazosin (CARDURA) 2 MG tablet Take 1 tablet (2 mg total) by mouth daily. 05/30/14   Denita Lung, MD  glipiZIDE (GLUCOTROL XL) 5 MG 24 hr tablet TAKE 1 TABLET BY MOUTH TWICE DAILY BEFORE A MEAL 09/06/13   Denita Lung, MD  glucosamine-chondroitin 500-400 MG tablet Take 1 tablet by mouth 3 (three) times daily.      Historical Provider, MD  insulin detemir (LEVEMIR) 100 UNIT/ML injection Inject 0.24 mLs (24 Units total) into the skin at bedtime. 05/08/13   Denita Lung, MD  Insulin Detemir (LEVEMIR) 100 UNIT/ML Pen Inject 24 Units into the skin daily at 10 pm. 06/22/14   Denita Lung, MD  Insulin Pen Needle (BD PEN NEEDLE NANO U/F) 32G X 4 MM MISC This is for levemir pen 07/19/14   Denita Lung, MD  lisinopril-hydrochlorothiazide (ZESTORETIC) 20-12.5 MG per tablet Take 1 tablet by mouth daily. 05/30/14   Denita Lung, MD  metFORMIN (GLUCOPHAGE) 1000 MG tablet Take 1 tablet (1,000 mg total) by mouth 2 (two) times  daily. 07/11/14   Denita Lung, MD  Multiple Vitamins-Minerals (MULTIVITAMIN WITH MINERALS) tablet Take 1 tablet by mouth daily.      Historical Provider, MD  simvastatin (ZOCOR) 40 MG tablet Take 1 tablet (40 mg total) by mouth at bedtime. 05/10/14   Denita Lung, MD  simvastatin (ZOCOR) 40 MG tablet TAKE 1 TABLET BY MOUTH EVERY NIGHT AT BEDTIME 05/14/14   Denita Lung, MD    Family History No family history on file.  Social History Social History  Substance Use Topics  . Smoking status: Former Smoker    Quit date: 03/23/1986  . Smokeless tobacco: Never Used  . Alcohol use No     Allergies   Sulfa antibiotics and Morphine and related   Review of Systems Review of Systems  All other systems reviewed and are negative.    Physical Exam Updated Vital Signs BP 160/74 (BP Location: Right  Arm)   Pulse 98   Temp 97.9 F (36.6 C) (Oral)   Resp 20   Ht 5\' 8"  (1.727 m)   Wt 235 lb (106.6 kg)   SpO2 96%   BMI 35.73 kg/m   Physical Exam  Constitutional: He is oriented to person, place, and time. He appears well-developed and well-nourished. No distress.  HENT:  Head: Normocephalic and atraumatic.  Mouth/Throat: Oropharynx is clear and moist.  Neck: Normal range of motion. Neck supple.  Cardiovascular: Normal rate and regular rhythm.  Exam reveals no friction rub.   No murmur heard. Pulmonary/Chest: Effort normal. No respiratory distress. He has wheezes. He has no rales.  There are bilateral expiratory rhonchi present.  Abdominal: Soft. Bowel sounds are normal. He exhibits no distension. There is no tenderness.  Musculoskeletal: Normal range of motion. He exhibits no edema.  Neurological: He is alert and oriented to person, place, and time. Coordination normal.  Skin: Skin is warm and dry. He is not diaphoretic.  There is a skin tear to the dorsum of the left hand. This appears slightly warm to the touch, however I see no purulent drainage. There is no streaking extending into the forearm. He has good range of motion.  Nursing note and vitals reviewed.    ED Treatments / Results  Labs (all labs ordered are listed, but only abnormal results are displayed) Labs Reviewed - No data to display  EKG  EKG Interpretation None       Radiology No results found.  Procedures Procedures (including critical care time)  Medications Ordered in ED Medications - No data to display   Initial Impression / Assessment and Plan / ED Course  I have reviewed the triage vital signs and the nursing notes.  Pertinent labs & imaging results that were available during my care of the patient were reviewed by me and considered in my medical decision making (see chart for details).  Clinical Course     The hand wound will be treated with bacitracin and dressing changes. He will  also be prescribed an antibiotic to cover for both pulmonary infection and possible early cellulitis to his left hand wound. He is also requested medication for pain for his rib injury.  Final Clinical Impressions(s) / ED Diagnoses   Final diagnoses:  None    New Prescriptions New Prescriptions   No medications on file     Veryl Speak, MD 02/07/16 1314

## 2016-02-07 NOTE — ED Triage Notes (Signed)
Here to have his left hand wound rechecked. He fell 3 days ago and sustained a skin abrasion. His hand is swelling. The co band looks like it was applied tightly. His wife last the dressing last night.Marland Kitchen

## 2016-02-07 NOTE — ED Notes (Signed)
Patient denies pain and is resting comfortably.  

## 2016-02-11 ENCOUNTER — Emergency Department (HOSPITAL_COMMUNITY): Payer: PPO

## 2016-02-11 ENCOUNTER — Emergency Department (HOSPITAL_COMMUNITY)
Admission: EM | Admit: 2016-02-11 | Discharge: 2016-02-11 | Disposition: A | Discharge status: Home / Self Care | Payer: PPO | Attending: Emergency Medicine | Admitting: Emergency Medicine

## 2016-02-11 ENCOUNTER — Encounter (HOSPITAL_COMMUNITY): Payer: Self-pay | Admitting: Emergency Medicine

## 2016-02-11 DIAGNOSIS — Z79899 Other long term (current) drug therapy: Secondary | ICD-10-CM | POA: Diagnosis not present

## 2016-02-11 DIAGNOSIS — E119 Type 2 diabetes mellitus without complications: Secondary | ICD-10-CM | POA: Insufficient documentation

## 2016-02-11 DIAGNOSIS — I10 Essential (primary) hypertension: Secondary | ICD-10-CM | POA: Insufficient documentation

## 2016-02-11 DIAGNOSIS — R05 Cough: Secondary | ICD-10-CM

## 2016-02-11 DIAGNOSIS — Z87891 Personal history of nicotine dependence: Secondary | ICD-10-CM | POA: Insufficient documentation

## 2016-02-11 DIAGNOSIS — R079 Chest pain, unspecified: Secondary | ICD-10-CM | POA: Diagnosis not present

## 2016-02-11 DIAGNOSIS — Z794 Long term (current) use of insulin: Secondary | ICD-10-CM | POA: Insufficient documentation

## 2016-02-11 DIAGNOSIS — Z7982 Long term (current) use of aspirin: Secondary | ICD-10-CM | POA: Insufficient documentation

## 2016-02-11 DIAGNOSIS — R059 Cough, unspecified: Secondary | ICD-10-CM

## 2016-02-11 DIAGNOSIS — R0602 Shortness of breath: Secondary | ICD-10-CM | POA: Diagnosis not present

## 2016-02-11 MED ORDER — ALBUTEROL SULFATE (2.5 MG/3ML) 0.083% IN NEBU: 5.0000 mg | INHALATION_SOLUTION | Freq: Once | RESPIRATORY_TRACT | Status: DC

## 2016-02-11 NOTE — ED Triage Notes (Signed)
Patient feels he has fluid in his lungs due to a "sloashing feeling my lungs".  Patient state that he had double PNA in 2014 and state these symptoms feel similar.  Patient fell week ago and hurt left ribs and states that he had bronchitis then.

## 2016-02-11 NOTE — ED Provider Notes (Signed)
[facility] DEPT Provider Note   CSN: [id number] Arrival date & time: 02/11/16  1219     History   Chief Complaint Chief Complaint  Patient presents with  . feels like has fluid in lungs    HPI ULESS OGREN is a 70 y.o. male.  70 year old male presents with a feeling of fluid in his lungs times several days. Is recovering from bronchitis and states his cough is actually improved. He was treated with Zithromax as well as an inhaler. He denies dyspnea, lower extremity swelling, orthopnea. Patient states that he recently was diagnosed with a rib contusion but has been deep breathing without difficulty. Says he feels generally okay but was concerned that he might be in CHF. He denies any prior history of CHF.      Past Medical History:  Diagnosis Date  . Allergy   . Arthritis   . BPH (benign prostatic hyperplasia)   . Diabetes mellitus   . Dyslipidemia   . ED (erectile dysfunction)   . History of colonic polyps   . Hypertension   . Obesity   . Prostatitis   . Thyroid disease     Patient Active Problem List   Diagnosis Date Noted  . Anemia of chronic disease 06/30/2012  . Personal history of noncompliance with medical treatment, presenting hazards to health 04/28/2011  . Hyperlipidemia LDL goal <70 09/05/2010  . Hypertension associated with diabetes ([place]) 09/05/2010  . Obesity (BMI 30-39.9) 09/05/2010    Past Surgical History:  Procedure Laterality Date  . BACK SURGERY  2009   OLIN  . COLONOSCOPY  2006   HAYES  . HERNIA REPAIR  1996  . REVISION TOTAL HIP ARTHROPLASTY  11/07   RIGHT OLIN  . TONSILLECTOMY     Pt was 70 yrs old       Home Medications    Prior to Admission medications   Medication Sig Start Date End Date Taking? Authorizing Provider  aspirin 325 MG tablet Take 325 mg by mouth daily.      Historical Provider, MD  cholecalciferol (VITAMIN D) 1000 UNITS tablet Take 1,000 Units by mouth daily.    Historical Provider, MD  ciprofloxacin  (CIPRO) 500 MG tablet Take 1 tablet (500 mg total) by mouth 2 (two) times daily. 10/23/15   John Molpus, MD  doxazosin (CARDURA) 2 MG tablet Take 1 tablet (2 mg total) by mouth daily. 05/30/14   Denita Lung, MD  doxycycline (VIBRAMYCIN) 100 MG capsule Take 1 capsule (100 mg total) by mouth 2 (two) times daily. One po bid x 7 days 02/07/16   Veryl Speak, MD  glipiZIDE (GLUCOTROL XL) 5 MG 24 hr tablet TAKE 1 TABLET BY MOUTH TWICE DAILY BEFORE A MEAL 09/06/13   Denita Lung, MD  glucosamine-chondroitin 500-400 MG tablet Take 1 tablet by mouth 3 (three) times daily.      Historical Provider, MD  HYDROcodone-acetaminophen (NORCO) 5-325 MG tablet Take 1-2 tablets by mouth every 6 (six) hours as needed. 02/07/16   Veryl Speak, MD  insulin detemir (LEVEMIR) 100 UNIT/ML injection Inject 0.24 mLs (24 Units total) into the skin at bedtime. 05/08/13   Denita Lung, MD  Insulin Detemir (LEVEMIR) 100 UNIT/ML Pen Inject 24 Units into the skin daily at 10 pm. 06/22/14   Denita Lung, MD  Insulin Pen Needle (BD PEN NEEDLE NANO U/F) 32G X 4 MM MISC This is for levemir pen 07/19/14   Denita Lung, MD  lisinopril-hydrochlorothiazide (ZESTORETIC) 20-12.5 MG  per tablet Take 1 tablet by mouth daily. 05/30/14   Denita Lung, MD  metFORMIN (GLUCOPHAGE) 1000 MG tablet Take 1 tablet (1,000 mg total) by mouth 2 (two) times daily. 07/11/14   Denita Lung, MD  Multiple Vitamins-Minerals (MULTIVITAMIN WITH MINERALS) tablet Take 1 tablet by mouth daily.      Historical Provider, MD  predniSONE (DELTASONE) 10 MG tablet Take 2 tablets (20 mg total) by mouth 2 (two) times daily. 02/07/16   Veryl Speak, MD  simvastatin (ZOCOR) 40 MG tablet Take 1 tablet (40 mg total) by mouth at bedtime. 05/10/14   Denita Lung, MD  simvastatin (ZOCOR) 40 MG tablet TAKE 1 TABLET BY MOUTH EVERY NIGHT AT BEDTIME 05/14/14   Denita Lung, MD    Family History No family history on file.  Social History Social History  Substance Use Topics  .  Smoking status: Former Smoker    Quit date: 03/23/1986  . Smokeless tobacco: Never Used  . Alcohol use No     Allergies   Sulfa antibiotics and Morphine and related   Review of Systems Review of Systems  All other systems reviewed and are negative.    Physical Exam Updated Vital Signs BP 159/74 (BP Location: Left Arm)   Pulse 102   Temp 97.6 F (36.4 C) (Oral)   Resp 21   SpO2 97%   Physical Exam  Constitutional: He is oriented to person, place, and time. He appears well-developed and well-nourished.  Non-toxic appearance. No distress.  HENT:  Head: Normocephalic and atraumatic.  Eyes: Conjunctivae, EOM and lids are normal. Pupils are equal, round, and reactive to light.  Neck: Normal range of motion. Neck supple. No tracheal deviation present. No thyroid mass present.  Cardiovascular: Normal rate, regular rhythm and normal heart sounds.  Exam reveals no gallop.   No murmur heard. Pulmonary/Chest: Effort normal and breath sounds normal. No stridor. No respiratory distress. He has no decreased breath sounds. He has no wheezes. He has no rhonchi. He has no rales.  Abdominal: Soft. Normal appearance and bowel sounds are normal. He exhibits no distension. There is no tenderness. There is no rebound and no CVA tenderness.  Musculoskeletal: Normal range of motion. He exhibits no edema or tenderness.  Neurological: He is alert and oriented to person, place, and time. He has normal strength. No cranial nerve deficit or sensory deficit. GCS eye subscore is 4. GCS verbal subscore is 5. GCS motor subscore is 6.  Skin: Skin is warm and dry. No abrasion and no rash noted.  Psychiatric: He has a normal mood and affect. His speech is normal and behavior is normal.  Nursing note and vitals reviewed.    ED Treatments / Results  Labs (all labs ordered are listed, but only abnormal results are displayed) Labs Reviewed - No data to display  EKG  EKG Interpretation  Date/Time:  Tuesday  February 11 2016 12:26:39 EST Ventricular Rate:  102 PR Interval:    QRS Duration: 85 QT Interval:  352 QTC Calculation: 459 R Axis:   -2 Text Interpretation:  Sinus tachycardia Abnormal R-wave progression, late transition Nonspecific T abnormalities, lateral leads Baseline wander in lead(s) V1 No significant change since last tracing Confirmed by Lucca Greggs  MD, Lestine Rahe ([id number]) on 02/11/2016 1:48:00 PM       Radiology Dg Chest 2 View  Result Date: 02/11/2016 CLINICAL DATA:  70 year old diabetic hypertensive male fell off curb last week. Chest pain. Shortness of breath. Subsequent encounter. EXAM: CHEST  2 VIEW COMPARISON:  02/04/2016 rib films. 06/27/2012 chest CT. 06/25/2012 two-view chest. FINDINGS: Cardiomegaly. Central pulmonary vascular prominence without pulmonary edema. Sclerotic left fifth rib unchanged since 2014. Degenerative changes thoracic spine without focal compression fracture. No pneumothorax. No segmental consolidation. Minimal left base subsegmental atelectasis. No plain film evidence of pulmonary malignancy. Shoulder joint degenerative changes. Vascular calcifications. IMPRESSION: Cardiomegaly. Central pulmonary vascular prominence without pulmonary edema. Sclerotic left fifth rib unchanged since 2014. Degenerative changes thoracic spine without focal compression fracture. No pneumothorax. No segmental consolidation. Minimal left base subsegmental atelectasis. Electronically Signed   By: Genia Del M.D.   On: 02/11/2016 13:48    Procedures Procedures (including critical care time)  Medications Ordered in ED Medications  albuterol (PROVENTIL) (2.5 MG/3ML) 0.083% nebulizer solution 5 mg (not administered)     Initial Impression / Assessment and Plan / ED Course  I have reviewed the triage vital signs and the nursing notes.  Pertinent labs & imaging results that were available during my care of the patient were reviewed by me and considered in my medical decision making  (see chart for details).  Clinical Course     Chest x-ray without acute findings. Pulse oximetry stable. Patient's symptoms have been resolving. No further workup needed at this time.  Final Clinical Impressions(s) / ED Diagnoses   Final diagnoses:  None    New Prescriptions New Prescriptions   No medications on file     Lacretia Leigh, MD 02/11/16 1414

## 2016-02-11 NOTE — ED Notes (Signed)
Bed: WA05 Expected date:  Expected time:  Means of arrival:  Comments: Hold for triage 3 

## 2016-11-10 DIAGNOSIS — R972 Elevated prostate specific antigen [PSA]: Secondary | ICD-10-CM | POA: Diagnosis not present

## 2016-11-26 DIAGNOSIS — N471 Phimosis: Secondary | ICD-10-CM | POA: Diagnosis not present

## 2016-11-26 DIAGNOSIS — Z125 Encounter for screening for malignant neoplasm of prostate: Secondary | ICD-10-CM | POA: Diagnosis not present

## 2016-11-26 DIAGNOSIS — R3912 Poor urinary stream: Secondary | ICD-10-CM | POA: Diagnosis not present

## 2016-11-26 DIAGNOSIS — R8279 Other abnormal findings on microbiological examination of urine: Secondary | ICD-10-CM | POA: Diagnosis not present

## 2017-02-04 ENCOUNTER — Telehealth: Payer: Self-pay | Admitting: Family Medicine

## 2017-02-04 NOTE — Telephone Encounter (Signed)
Called pt to sched diab follow up and he states he now goes to the [facility] hospital.

## 2017-03-09 DIAGNOSIS — H6121 Impacted cerumen, right ear: Secondary | ICD-10-CM | POA: Diagnosis not present

## 2017-03-09 DIAGNOSIS — H60331 Swimmer's ear, right ear: Secondary | ICD-10-CM | POA: Diagnosis not present

## 2017-04-08 ENCOUNTER — Encounter (HOSPITAL_BASED_OUTPATIENT_CLINIC_OR_DEPARTMENT_OTHER): Payer: Self-pay

## 2017-04-08 ENCOUNTER — Emergency Department (HOSPITAL_BASED_OUTPATIENT_CLINIC_OR_DEPARTMENT_OTHER)
Admission: EM | Admit: 2017-04-08 | Discharge: 2017-04-08 | Disposition: A | Discharge status: Home / Self Care | Payer: PPO | Attending: Emergency Medicine | Admitting: Emergency Medicine

## 2017-04-08 ENCOUNTER — Other Ambulatory Visit: Payer: Self-pay

## 2017-04-08 DIAGNOSIS — L03115 Cellulitis of right lower limb: Secondary | ICD-10-CM | POA: Diagnosis not present

## 2017-04-08 DIAGNOSIS — Z87891 Personal history of nicotine dependence: Secondary | ICD-10-CM | POA: Insufficient documentation

## 2017-04-08 DIAGNOSIS — Z794 Long term (current) use of insulin: Secondary | ICD-10-CM | POA: Diagnosis not present

## 2017-04-08 DIAGNOSIS — M79674 Pain in right toe(s): Secondary | ICD-10-CM | POA: Diagnosis present

## 2017-04-08 DIAGNOSIS — E11621 Type 2 diabetes mellitus with foot ulcer: Secondary | ICD-10-CM | POA: Diagnosis not present

## 2017-04-08 DIAGNOSIS — L97511 Non-pressure chronic ulcer of other part of right foot limited to breakdown of skin: Secondary | ICD-10-CM | POA: Insufficient documentation

## 2017-04-08 DIAGNOSIS — I10 Essential (primary) hypertension: Secondary | ICD-10-CM | POA: Diagnosis not present

## 2017-04-08 DIAGNOSIS — E08621 Diabetes mellitus due to underlying condition with foot ulcer: Secondary | ICD-10-CM

## 2017-04-08 DIAGNOSIS — Z79899 Other long term (current) drug therapy: Secondary | ICD-10-CM | POA: Diagnosis not present

## 2017-04-08 DIAGNOSIS — Z7982 Long term (current) use of aspirin: Secondary | ICD-10-CM | POA: Diagnosis not present

## 2017-04-08 MED ORDER — CEPHALEXIN 500 MG PO CAPS: 500.0000 mg | ORAL_CAPSULE | Freq: Four times a day (QID) | ORAL | 0 refills | Status: AC

## 2017-04-08 MED FILL — CEPHALEXIN 500 MG CAPSULE: 500 | 14 days supply | Qty: 56 | Fill #0

## 2017-04-08 NOTE — ED Triage Notes (Addendum)
Pt states he noticed a scab on right 2nd toe approx 1-2 weeks ago-denies known injury-states toe now is red-NAD-steady gait

## 2017-04-08 NOTE — ED Provider Notes (Signed)
[facility] EMERGENCY DEPARTMENT Provider Note   CSN: [id number] Arrival date & time: 04/08/17  1059     History   Chief Complaint Chief Complaint  Patient presents with  . Toe Pain    HPI [name] is a 72 y.o. male.  The history is provided by the patient and the spouse. No language interpreter was used.  Toe Pain     [name] is a 72 y.o. male who presents to the Emergency Department complaining of toe pain.  Reports 3-4 days of pain to the right second toe.  He noticed that there was a small scab there from ill fitting shoes for the last 2 weeks and redness over the last 3-4 days.  He does have increased pain in the toe is worse with walking.  No fevers, chills, malaise.  He does have a history of diabetes that he states is well controlled.  He does have a primary care provider as well as a podiatrist.  Past Medical History:  Diagnosis Date  . Allergy   . Arthritis   . BPH (benign prostatic hyperplasia)   . Diabetes mellitus   . Dyslipidemia   . ED (erectile dysfunction)   . History of colonic polyps   . Hypertension   . Obesity   . Prostatitis   . Thyroid disease     Patient Active Problem List   Diagnosis Date Noted  . Anemia of chronic disease 06/30/2012  . Personal history of noncompliance with medical treatment, presenting hazards to health 04/28/2011  . Hyperlipidemia LDL goal <70 09/05/2010  . Hypertension associated with diabetes ([place]) 09/05/2010  . Obesity (BMI 30-39.9) 09/05/2010    Past Surgical History:  Procedure Laterality Date  . BACK SURGERY  2009   OLIN  . COLONOSCOPY  2006   HAYES  . HERNIA REPAIR  1996  . REVISION TOTAL HIP ARTHROPLASTY  11/07   RIGHT OLIN  . TONSILLECTOMY     Pt was 72 yrs old       Home Medications    Prior to Admission medications   Medication Sig Start Date End Date Taking? Authorizing Provider  aspirin 325 MG tablet Take 325 mg by mouth daily.      [provider]    cephALEXin (KEFLEX) 500 MG capsule Take 1 capsule (500 mg total) by mouth 4 (four) times daily for 14 days. 04/08/17 04/22/17  Quintella Reichert, MD  cholecalciferol (VITAMIN D) 1000 UNITS tablet Take 1,000 Units by mouth daily.    [provider]  ciprofloxacin (CIPRO) 500 MG tablet Take 1 tablet (500 mg total) by mouth 2 (two) times daily. 10/23/15   Molpus, John, MD  doxazosin (CARDURA) 2 MG tablet Take 1 tablet (2 mg total) by mouth daily. 05/30/14   Denita Lung, MD  doxycycline (VIBRAMYCIN) 100 MG capsule Take 1 capsule (100 mg total) by mouth 2 (two) times daily. One po bid x 7 days 02/07/16   Veryl Speak, MD  glipiZIDE (GLUCOTROL XL) 5 MG 24 hr tablet TAKE 1 TABLET BY MOUTH TWICE DAILY BEFORE A MEAL 09/06/13   Denita Lung, MD  glucosamine-chondroitin 500-400 MG tablet Take 1 tablet by mouth 3 (three) times daily.      [provider]  HYDROcodone-acetaminophen (NORCO) 5-325 MG tablet Take 1-2 tablets by mouth every 6 (six) hours as needed. 02/07/16   Veryl Speak, MD  insulin detemir (LEVEMIR) 100 UNIT/ML injection Inject 0.24 mLs (24 Units total) into the skin  at bedtime. 05/08/13   Denita Lung, MD  Insulin Detemir (LEVEMIR) 100 UNIT/ML Pen Inject 24 Units into the skin daily at 10 pm. 06/22/14   Denita Lung, MD  Insulin Pen Needle (BD PEN NEEDLE NANO U/F) 32G X 4 MM MISC This is for levemir pen 07/19/14   Denita Lung, MD  lisinopril-hydrochlorothiazide (ZESTORETIC) 20-12.5 MG per tablet Take 1 tablet by mouth daily. 05/30/14   Denita Lung, MD  metFORMIN (GLUCOPHAGE) 1000 MG tablet Take 1 tablet (1,000 mg total) by mouth 2 (two) times daily. 07/11/14   Denita Lung, MD  Multiple Vitamins-Minerals (MULTIVITAMIN WITH MINERALS) tablet Take 1 tablet by mouth daily.      [provider]  predniSONE (DELTASONE) 10 MG tablet Take 2 tablets (20 mg total) by mouth 2 (two) times daily. 02/07/16   Veryl Speak, MD  simvastatin (ZOCOR) 40 MG tablet Take 1 tablet  (40 mg total) by mouth at bedtime. 05/10/14   Denita Lung, MD  simvastatin (ZOCOR) 40 MG tablet TAKE 1 TABLET BY MOUTH EVERY NIGHT AT BEDTIME 05/14/14   Denita Lung, MD    Family History No family history on file.  Social History Social History   Tobacco Use  . Smoking status: Former Smoker    Last attempt to quit: 03/23/1986    Years since quitting: 31.0  . Smokeless tobacco: Never Used  Substance Use Topics  . Alcohol use: No  . Drug use: No     Allergies   Sulfa antibiotics and Morphine and related   Review of Systems Review of Systems  All other systems reviewed and are negative.    Physical Exam Updated Vital Signs BP 127/66 (BP Location: Left Arm)   Pulse 68   Temp 98 F (36.7 C) (Oral)   Resp 18   Ht 5\' 8"  (1.727 m)   Wt 104.3 kg (230 lb)   SpO2 98%   BMI 34.97 kg/m   Physical Exam  Constitutional: He is oriented to person, place, and time. He appears well-developed and well-nourished.  HENT:  Head: Normocephalic and atraumatic.  Cardiovascular: Normal rate and regular rhythm.  Pulmonary/Chest: Effort normal. No respiratory distress.  Musculoskeletal:  2+ DP pulses bilaterally.  There is a superficial eschar on the dorsal aspect of the right second toe with a hammertoe deformity.  There is mild surrounding erythema and edema.  Erythema and edema extends to the distal foot at the base of the toes.  Tenderness is only located in the second digit.  Neurological: He is alert and oriented to person, place, and time.  Skin: Skin is warm and dry.  Psychiatric: He has a normal mood and affect. His behavior is normal.  Nursing note and vitals reviewed.      ED Treatments / Results  Labs (all labs ordered are listed, but only abnormal results are displayed) Labs Reviewed - No data to display  EKG  EKG Interpretation None       Radiology No results found.  Procedures Procedures (including critical care time)  Medications Ordered in  ED Medications - No data to display   Initial Impression / Assessment and Plan / ED Course  I have reviewed the triage vital signs and the nursing notes.  Pertinent labs & imaging results that were available during my care of the patient were reviewed by me and considered in my medical decision making (see chart for details).     Patient with history of diabetes here  with pain and swelling to the right second toe.  Examination is concerning for superficial ulcer with developing cellulitis.  He is nontoxic appearing in the department.  Counseled patient on home care with antibiotics as well as local wound care and avoiding pressure injury from his shoes.  Discussed importance of outpatient follow-up and return precautions.  Final Clinical Impressions(s) / ED Diagnoses   Final diagnoses:  Cellulitis of right lower extremity  Diabetic ulcer of toe of right foot associated with diabetes mellitus due to underlying condition, limited to breakdown of skin [facility])    ED Discharge Orders        Ordered    cephALEXin (KEFLEX) 500 MG capsule  4 times daily     04/08/17 1310       Quintella Reichert, MD 04/08/17 1315

## 2017-06-01 DIAGNOSIS — J00 Acute nasopharyngitis [common cold]: Secondary | ICD-10-CM | POA: Diagnosis not present

## 2017-07-01 DIAGNOSIS — E119 Type 2 diabetes mellitus without complications: Secondary | ICD-10-CM | POA: Diagnosis not present

## 2017-08-10 ENCOUNTER — Emergency Department (HOSPITAL_BASED_OUTPATIENT_CLINIC_OR_DEPARTMENT_OTHER): Payer: PPO

## 2017-08-10 ENCOUNTER — Other Ambulatory Visit: Payer: Self-pay

## 2017-08-10 ENCOUNTER — Emergency Department (HOSPITAL_BASED_OUTPATIENT_CLINIC_OR_DEPARTMENT_OTHER)
Admission: EM | Admit: 2017-08-10 | Discharge: 2017-08-10 | Disposition: A | Discharge status: Home / Self Care | Payer: PPO | Attending: Emergency Medicine | Admitting: Emergency Medicine

## 2017-08-10 ENCOUNTER — Encounter (HOSPITAL_BASED_OUTPATIENT_CLINIC_OR_DEPARTMENT_OTHER): Payer: Self-pay | Admitting: *Deleted

## 2017-08-10 DIAGNOSIS — Z7982 Long term (current) use of aspirin: Secondary | ICD-10-CM | POA: Diagnosis not present

## 2017-08-10 DIAGNOSIS — S161XXA Strain of muscle, fascia and tendon at neck level, initial encounter: Secondary | ICD-10-CM | POA: Insufficient documentation

## 2017-08-10 DIAGNOSIS — I10 Essential (primary) hypertension: Secondary | ICD-10-CM | POA: Diagnosis not present

## 2017-08-10 DIAGNOSIS — M545 Low back pain, unspecified: Secondary | ICD-10-CM

## 2017-08-10 DIAGNOSIS — S199XXA Unspecified injury of neck, initial encounter: Secondary | ICD-10-CM | POA: Diagnosis not present

## 2017-08-10 DIAGNOSIS — Y929 Unspecified place or not applicable: Secondary | ICD-10-CM | POA: Diagnosis not present

## 2017-08-10 DIAGNOSIS — Z79899 Other long term (current) drug therapy: Secondary | ICD-10-CM | POA: Diagnosis not present

## 2017-08-10 DIAGNOSIS — Y9389 Activity, other specified: Secondary | ICD-10-CM | POA: Insufficient documentation

## 2017-08-10 DIAGNOSIS — Z96641 Presence of right artificial hip joint: Secondary | ICD-10-CM | POA: Insufficient documentation

## 2017-08-10 DIAGNOSIS — W11XXXA Fall on and from ladder, initial encounter: Secondary | ICD-10-CM | POA: Insufficient documentation

## 2017-08-10 DIAGNOSIS — S0001XA Abrasion of scalp, initial encounter: Secondary | ICD-10-CM | POA: Diagnosis not present

## 2017-08-10 DIAGNOSIS — S0990XA Unspecified injury of head, initial encounter: Secondary | ICD-10-CM | POA: Insufficient documentation

## 2017-08-10 DIAGNOSIS — E119 Type 2 diabetes mellitus without complications: Secondary | ICD-10-CM | POA: Insufficient documentation

## 2017-08-10 DIAGNOSIS — Y998 Other external cause status: Secondary | ICD-10-CM | POA: Insufficient documentation

## 2017-08-10 DIAGNOSIS — Z794 Long term (current) use of insulin: Secondary | ICD-10-CM | POA: Diagnosis not present

## 2017-08-10 DIAGNOSIS — S3992XA Unspecified injury of lower back, initial encounter: Secondary | ICD-10-CM | POA: Diagnosis not present

## 2017-08-10 MED ORDER — CYCLOBENZAPRINE HCL 10 MG PO TABS
10.0000 mg | ORAL_TABLET | Freq: Once | ORAL | Status: AC
  Administered 2017-08-10: 10 mg via ORAL
  Filled 2017-08-10: qty 1

## 2017-08-10 MED ORDER — CYCLOBENZAPRINE HCL 10 MG PO TABS: 10.0000 mg | ORAL_TABLET | Freq: Two times a day (BID) | ORAL | 0 refills | Status: DC | PRN

## 2017-08-10 NOTE — ED Notes (Signed)
Patient stated he was coming down on the ladder and on the 2 step and the ladder twisted.  He fell on his buttock and hit the top of his head on the stoop.  Abrasion to the top of his head, c collar placed in triage for c/o pain to his neck and also c/o pain to his lower back.  Patient states he takes ASA 325mg  daily.  [place] LOC

## 2017-08-10 NOTE — Discharge Instructions (Addendum)
Your evaluated in the emergency department for a fall off a ladder in which she you hit your head neck and back.  Your CAT scan and x-rays did not show any signs of significant damage.  You should take Tylenol and ibuprofen for pain.  Ice the areas that are bothering you.  You were asking for a muscle relaxant and so we have prescribed Flexeril for you to use up to twice a day as needed for spasm.  This may be sedating and may make you more unsteady on your feet so please be careful with this medicine.  Please follow-up with your doctor and return if any worsening symptoms.

## 2017-08-10 NOTE — ED Notes (Signed)
Patient standing in the room.  States he hurts more when he sits down.

## 2017-08-10 NOTE — ED Provider Notes (Signed)
[facility] Provider Note   CSN: [id number] Arrival date & time: 08/10/17  1740     History   Chief Complaint Chief Complaint  Patient presents with  . Fall    HPI [name] is a 72 y.o. male.  He was on a stepladder coming down and fell when the ladder twisted from approximately the second step falling backwards onto his butt and striking his head on the concrete stoop.  There is no LOC.  He was able to ambulate after the fall.  He is complaining of an abrasion to the top of his head and moderate neck pain and tailbone pain.  There is no numbness no weakness.  He does not have an headache.  His tetanus is up-to-date.  He is got no chest pain no abdominal pain incontinence of urine or stool.  He is not anticoagulated.  The history is provided by the patient.  Fall  This is a new problem. The current episode started 3 to 5 hours ago. The problem has not changed since onset.Pertinent negatives include no chest pain, no abdominal pain, no headaches and no shortness of breath. Nothing aggravates the symptoms. The symptoms are relieved by acetaminophen. He has tried acetaminophen for the symptoms. The treatment provided mild relief.    Past Medical History:  Diagnosis Date  . Allergy   . Arthritis   . BPH (benign prostatic hyperplasia)   . Diabetes mellitus   . Dyslipidemia   . ED (erectile dysfunction)   . History of colonic polyps   . Hypertension   . Obesity   . Prostatitis   . Thyroid disease     Patient Active Problem List   Diagnosis Date Noted  . Anemia of chronic disease 06/30/2012  . Personal history of noncompliance with medical treatment, presenting hazards to health 04/28/2011  . Hyperlipidemia LDL goal <70 09/05/2010  . Hypertension associated with diabetes ([place]) 09/05/2010  . Obesity (BMI 30-39.9) 09/05/2010    Past Surgical History:  Procedure Laterality Date  . BACK SURGERY  2009   OLIN  . COLONOSCOPY  2006   HAYES    . HERNIA REPAIR  1996  . REVISION TOTAL HIP ARTHROPLASTY  11/07   RIGHT OLIN  . TONSILLECTOMY     Pt was 72 yrs old        Home Medications    Prior to Admission medications   Medication Sig Start Date End Date Taking? Authorizing Provider  aspirin 325 MG tablet Take 325 mg by mouth daily.      [provider]  cholecalciferol (VITAMIN D) 1000 UNITS tablet Take 1,000 Units by mouth daily.    [provider]  ciprofloxacin (CIPRO) 500 MG tablet Take 1 tablet (500 mg total) by mouth 2 (two) times daily. 10/23/15   Molpus, John, MD  doxazosin (CARDURA) 2 MG tablet Take 1 tablet (2 mg total) by mouth daily. 05/30/14   Denita Lung, MD  doxycycline (VIBRAMYCIN) 100 MG capsule Take 1 capsule (100 mg total) by mouth 2 (two) times daily. One po bid x 7 days 02/07/16   Veryl Speak, MD  glipiZIDE (GLUCOTROL XL) 5 MG 24 hr tablet TAKE 1 TABLET BY MOUTH TWICE DAILY BEFORE A MEAL 09/06/13   Denita Lung, MD  glucosamine-chondroitin 500-400 MG tablet Take 1 tablet by mouth 3 (three) times daily.      [provider]  HYDROcodone-acetaminophen (NORCO) 5-325 MG tablet Take 1-2 tablets by mouth every 6 (  six) hours as needed. 02/07/16   Veryl Speak, MD  insulin detemir (LEVEMIR) 100 UNIT/ML injection Inject 0.24 mLs (24 Units total) into the skin at bedtime. 05/08/13   Denita Lung, MD  Insulin Detemir (LEVEMIR) 100 UNIT/ML Pen Inject 24 Units into the skin daily at 10 pm. 06/22/14   Denita Lung, MD  Insulin Pen Needle (BD PEN NEEDLE NANO U/F) 32G X 4 MM MISC This is for levemir pen 07/19/14   Denita Lung, MD  lisinopril-hydrochlorothiazide (ZESTORETIC) 20-12.5 MG per tablet Take 1 tablet by mouth daily. 05/30/14   Denita Lung, MD  metFORMIN (GLUCOPHAGE) 1000 MG tablet Take 1 tablet (1,000 mg total) by mouth 2 (two) times daily. 07/11/14   Denita Lung, MD  Multiple Vitamins-Minerals (MULTIVITAMIN WITH MINERALS) tablet Take 1 tablet by mouth daily.      [provider]  predniSONE (DELTASONE) 10 MG tablet Take 2 tablets (20 mg total) by mouth 2 (two) times daily. 02/07/16   Veryl Speak, MD  simvastatin (ZOCOR) 40 MG tablet Take 1 tablet (40 mg total) by mouth at bedtime. 05/10/14   Denita Lung, MD  simvastatin (ZOCOR) 40 MG tablet TAKE 1 TABLET BY MOUTH EVERY NIGHT AT BEDTIME 05/14/14   Denita Lung, MD    Family History No family history on file.  Social History Social History   Tobacco Use  . Smoking status: Former Smoker    Last attempt to quit: 03/23/1986    Years since quitting: 31.4  . Smokeless tobacco: Never Used  Substance Use Topics  . Alcohol use: No  . Drug use: No     Allergies   Sulfa antibiotics and Morphine and related   Review of Systems Review of Systems  Constitutional: Negative for fever.  HENT: Negative for sore throat.   Eyes: Negative for visual disturbance.  Respiratory: Negative for shortness of breath.   Cardiovascular: Negative for chest pain.  Gastrointestinal: Negative for abdominal pain.  Genitourinary: Negative for dysuria.  Musculoskeletal: Positive for back pain and neck pain.  Skin: Positive for wound. Negative for rash.  Neurological: Negative for seizures and headaches.     Physical Exam Updated Vital Signs BP 132/69 (BP Location: Right Arm)   Pulse 70   Temp 97.9 F (36.6 C) (Oral)   Resp 16   Ht 5\' 8"  (1.727 m)   Wt 104.3 kg (230 lb)   SpO2 97%   BMI 34.97 kg/m   Physical Exam  Constitutional: He appears well-developed and well-nourished.  HENT:  Head: Normocephalic.    Eyes: Conjunctivae are normal.  Neck: Trachea normal and phonation normal. Spinous process tenderness and muscular tenderness present.  Cardiovascular: Normal rate and regular rhythm.  No murmur heard. Pulmonary/Chest: Effort normal and breath sounds normal. No respiratory distress.  Abdominal: Soft. There is no tenderness.  Musculoskeletal: He exhibits no edema.       Cervical back: He  exhibits tenderness, bony tenderness and pain. He exhibits no deformity.       Lumbar back: He exhibits tenderness and bony tenderness (Lower sacral spine.).  He is got full range of motion shoulders elbows wrists hips knees ankles.  He is ambulatory with a cane here.  Neurological: He is alert.  Skin: Skin is warm and dry.  Psychiatric: He has a normal mood and affect.  Nursing note and vitals reviewed.    ED Treatments / Results  Labs (all labs ordered are listed, but only abnormal results are displayed)  Labs Reviewed - No data to display  EKG None  Radiology Dg Lumbar Spine Complete  Result Date: 08/10/2017 CLINICAL DATA:  Fall from patio EXAM: [place] 4+ VIEW COMPARISON:  None. FINDINGS: No acute fracture of the lumbar spine. There is multilevel facet arthrosis with grade 1 L4-5 anterolisthesis. Vertebral body heights are maintained. There is osteophyte formation anteriorly at multiple levels. The disc spaces are preserved. IMPRESSION: No acute abnormality of the lumbar spine. Grade 1 degenerative L4-L5 anterolisthesis. Electronically Signed   By: Ulyses Jarred M.D.   On: 08/10/2017 22:37   Ct Head Wo Contrast  Result Date: 08/10/2017 CLINICAL DATA:  [place] from ladder hit top of head on concrete with abrasion EXAM: CT HEAD WITHOUT CONTRAST CT CERVICAL SPINE WITHOUT CONTRAST TECHNIQUE: Multidetector CT imaging of the head and cervical spine was performed following the standard protocol without intravenous contrast. Multiplanar CT image reconstructions of the cervical spine were also generated. COMPARISON:  CT brain 06/25/2012 FINDINGS: CT HEAD FINDINGS Brain: No acute territorial infarction, hemorrhage or intracranial mass. Stable moderate atrophy. Mild small vessel ischemic changes of the white matter. Stable ventricle size. Vascular: No hyperdense vessels.  Carotid vascular calcification Skull: No fracture Sinuses/Orbits: Hypoplastic right maxillary sinus with mucosal  thickening and chronic changes. Mucosal thickening in the ethmoid sinuses. No acute orbital abnormality Other: Small posterior scalp hematoma near the vertex CT CERVICAL SPINE FINDINGS Alignment: Straightening of the cervical spine. No subluxation. Facet alignment within normal limits. Skull base and vertebrae: No acute fracture. No primary bone lesion or focal pathologic process. Soft tissues and spinal canal: No prevertebral fluid or swelling. No visible canal hematoma. Disc levels: Moderate degenerative changes at C4-C5, C5-C6 and C6-C7 with mild degenerative changes at C3-C4 and C7-T1. Multiple level bilateral foraminal stenosis, moderate severe on the right at C3-C4. Upper chest: Lung apices are clear.  No thyroid mass. Other: None IMPRESSION: 1. No CT evidence for acute intracranial abnormality. Atrophy and small vessel ischemic changes of the white matter. 2. Straightening of the cervical spine with degenerative changes. No acute osseous abnormality. Electronically Signed   By: Donavan Foil M.D.   On: 08/10/2017 22:37   Ct Cervical Spine Wo Contrast  Result Date: 08/10/2017 CLINICAL DATA:  [place] from ladder hit top of head on concrete with abrasion EXAM: CT HEAD WITHOUT CONTRAST CT CERVICAL SPINE WITHOUT CONTRAST TECHNIQUE: Multidetector CT imaging of the head and cervical spine was performed following the standard protocol without intravenous contrast. Multiplanar CT image reconstructions of the cervical spine were also generated. COMPARISON:  CT brain 06/25/2012 FINDINGS: CT HEAD FINDINGS Brain: No acute territorial infarction, hemorrhage or intracranial mass. Stable moderate atrophy. Mild small vessel ischemic changes of the white matter. Stable ventricle size. Vascular: No hyperdense vessels.  Carotid vascular calcification Skull: No fracture Sinuses/Orbits: Hypoplastic right maxillary sinus with mucosal thickening and chronic changes. Mucosal thickening in the ethmoid sinuses. No acute orbital  abnormality Other: Small posterior scalp hematoma near the vertex CT CERVICAL SPINE FINDINGS Alignment: Straightening of the cervical spine. No subluxation. Facet alignment within normal limits. Skull base and vertebrae: No acute fracture. No primary bone lesion or focal pathologic process. Soft tissues and spinal canal: No prevertebral fluid or swelling. No visible canal hematoma. Disc levels: Moderate degenerative changes at C4-C5, C5-C6 and C6-C7 with mild degenerative changes at C3-C4 and C7-T1. Multiple level bilateral foraminal stenosis, moderate severe on the right at C3-C4. Upper chest: Lung apices are clear.  No thyroid mass. Other: None IMPRESSION: 1.  No CT evidence for acute intracranial abnormality. Atrophy and small vessel ischemic changes of the white matter. 2. Straightening of the cervical spine with degenerative changes. No acute osseous abnormality. Electronically Signed   By: Donavan Foil M.D.   On: 08/10/2017 22:37    Procedures Procedures (including critical care time)  Medications Ordered in ED Medications  cyclobenzaprine (FLEXERIL) tablet 10 mg (has no administration in time range)     Initial Impression / Assessment and Plan / ED Course  I have reviewed the triage vital signs and the nursing notes.  Pertinent labs & imaging results that were available during my care of the patient were reviewed by me and considered in my medical decision making (see chart for details).  Clinical Course as of Aug 11 999  Tue Aug 10, 2017  2243 CT head and C-spine and x-ray lumbosacral did not show any obvious signs of fracture or bleed.  Will remove c-collar and discharge patient with some symptomatic treatment.   [MB]    Clinical Course User Index [MB] Hayden Rasmussen, MD    Final Clinical Impressions(s) / ED Diagnoses   Final diagnoses:  Abrasion of scalp, initial encounter  Cervical strain, acute, initial encounter  Acute midline low back pain without sciatica    ED  Discharge Orders    None       Hayden Rasmussen, MD 08/11/17 1002

## 2017-08-10 NOTE — ED Triage Notes (Signed)
He fell off the 2nd step of a ladder. His head hit the concrete patio. Abrasion to the top of his head. No LOC. Neck pain and back pain. He is ambulatory. c collar applied at triage.

## 2017-08-10 NOTE — ED Notes (Signed)
Patient transported to xray and CT. 

## 2017-09-22 DIAGNOSIS — H6123 Impacted cerumen, bilateral: Secondary | ICD-10-CM | POA: Diagnosis not present

## 2017-11-24 DIAGNOSIS — R972 Elevated prostate specific antigen [PSA]: Secondary | ICD-10-CM | POA: Diagnosis not present

## 2017-11-30 DIAGNOSIS — N471 Phimosis: Secondary | ICD-10-CM | POA: Diagnosis not present

## 2018-03-11 DIAGNOSIS — H6123 Impacted cerumen, bilateral: Secondary | ICD-10-CM | POA: Diagnosis not present

## 2018-09-19 DIAGNOSIS — N411 Chronic prostatitis: Secondary | ICD-10-CM | POA: Diagnosis not present

## 2018-09-19 DIAGNOSIS — R8279 Other abnormal findings on microbiological examination of urine: Secondary | ICD-10-CM | POA: Diagnosis not present

## 2018-10-13 DIAGNOSIS — H6123 Impacted cerumen, bilateral: Secondary | ICD-10-CM | POA: Diagnosis not present

## 2018-10-24 ENCOUNTER — Other Ambulatory Visit: Payer: Self-pay

## 2018-12-22 DIAGNOSIS — Z125 Encounter for screening for malignant neoplasm of prostate: Secondary | ICD-10-CM | POA: Diagnosis not present

## 2018-12-27 DIAGNOSIS — Z125 Encounter for screening for malignant neoplasm of prostate: Secondary | ICD-10-CM | POA: Diagnosis not present

## 2018-12-27 DIAGNOSIS — N471 Phimosis: Secondary | ICD-10-CM | POA: Diagnosis not present

## 2019-10-17 DIAGNOSIS — H60511 Acute actinic otitis externa, right ear: Secondary | ICD-10-CM | POA: Diagnosis not present

## 2019-10-17 DIAGNOSIS — H6123 Impacted cerumen, bilateral: Secondary | ICD-10-CM | POA: Diagnosis not present

## 2019-12-12 DIAGNOSIS — E119 Type 2 diabetes mellitus without complications: Secondary | ICD-10-CM | POA: Diagnosis not present

## 2019-12-12 DIAGNOSIS — Z961 Presence of intraocular lens: Secondary | ICD-10-CM | POA: Diagnosis not present

## 2019-12-12 DIAGNOSIS — H5203 Hypermetropia, bilateral: Secondary | ICD-10-CM | POA: Diagnosis not present

## 2019-12-12 DIAGNOSIS — H524 Presbyopia: Secondary | ICD-10-CM | POA: Diagnosis not present

## 2019-12-27 DIAGNOSIS — N401 Enlarged prostate with lower urinary tract symptoms: Secondary | ICD-10-CM | POA: Diagnosis not present

## 2020-01-03 DIAGNOSIS — N471 Phimosis: Secondary | ICD-10-CM | POA: Diagnosis not present

## 2020-01-03 DIAGNOSIS — N401 Enlarged prostate with lower urinary tract symptoms: Secondary | ICD-10-CM | POA: Diagnosis not present

## 2020-01-03 DIAGNOSIS — R351 Nocturia: Secondary | ICD-10-CM | POA: Diagnosis not present

## 2020-01-06 DIAGNOSIS — N401 Enlarged prostate with lower urinary tract symptoms: Secondary | ICD-10-CM | POA: Diagnosis not present

## 2020-07-30 DIAGNOSIS — N401 Enlarged prostate with lower urinary tract symptoms: Secondary | ICD-10-CM | POA: Diagnosis not present

## 2020-07-30 DIAGNOSIS — R351 Nocturia: Secondary | ICD-10-CM | POA: Diagnosis not present

## 2020-07-30 DIAGNOSIS — N471 Phimosis: Secondary | ICD-10-CM | POA: Diagnosis not present

## 2020-09-26 DIAGNOSIS — N401 Enlarged prostate with lower urinary tract symptoms: Secondary | ICD-10-CM | POA: Diagnosis not present

## 2020-09-26 DIAGNOSIS — R351 Nocturia: Secondary | ICD-10-CM | POA: Diagnosis not present

## 2020-09-26 DIAGNOSIS — N471 Phimosis: Secondary | ICD-10-CM | POA: Diagnosis not present

## 2020-10-10 ENCOUNTER — Other Ambulatory Visit: Payer: Self-pay | Admitting: Urology

## 2020-12-03 ENCOUNTER — Encounter (HOSPITAL_BASED_OUTPATIENT_CLINIC_OR_DEPARTMENT_OTHER): Payer: Self-pay | Admitting: Urology

## 2020-12-05 ENCOUNTER — Encounter (HOSPITAL_BASED_OUTPATIENT_CLINIC_OR_DEPARTMENT_OTHER): Payer: Self-pay | Admitting: Urology

## 2020-12-05 ENCOUNTER — Other Ambulatory Visit: Payer: Self-pay

## 2020-12-05 NOTE — Progress Notes (Signed)
Spoke w/ via phone for pre-op interview--- Pt Lab needs dos----  [employer] and ekg             Lab results------ no COVID test -----patient states asymptomatic no test needed Arrive at ------- 1130 on 12-09-2020 NPO after MN NO Solid Food.  Clear liquids from MN until--- 1030 Med rec completed Medications to take morning of surgery ----- norvasc, flomax, synthroid, primidone Diabetic medication ----- do not take metformin, glipizide, alogliptin, jardiance morning of surgery and only do half dose levermir insulin night befire surgery Patient instructed no nail polish to be worn day of surgery Patient instructed to bring photo id and insurance card day of surgery Patient aware to have Driver (ride ) / caregiver  for 24 hours after surgery --wife, wanda Patient Special Instructions ----- n/a Pre-Op special Istructions ----- n/a Patient verbalized understanding of instructions that were given at this phone interview. Patient denies shortness of breath, chest pain, fever, cough at this phone interview.

## 2020-12-09 ENCOUNTER — Encounter (HOSPITAL_BASED_OUTPATIENT_CLINIC_OR_DEPARTMENT_OTHER): Payer: Self-pay | Admitting: Urology

## 2020-12-09 ENCOUNTER — Ambulatory Visit (HOSPITAL_BASED_OUTPATIENT_CLINIC_OR_DEPARTMENT_OTHER): Payer: PPO | Admitting: Anesthesiology

## 2020-12-09 ENCOUNTER — Ambulatory Visit (HOSPITAL_BASED_OUTPATIENT_CLINIC_OR_DEPARTMENT_OTHER)
Admission: RE | Admit: 2020-12-09 | Discharge: 2020-12-09 | Disposition: A | Discharge status: Home / Self Care | Payer: PPO | Attending: Urology | Admitting: Urology

## 2020-12-09 ENCOUNTER — Encounter (HOSPITAL_BASED_OUTPATIENT_CLINIC_OR_DEPARTMENT_OTHER)
Admission: RE | Disposition: A | Discharge status: Home / Self Care | Payer: Self-pay | Source: Home / Self Care | Attending: Urology

## 2020-12-09 DIAGNOSIS — R3915 Urgency of urination: Secondary | ICD-10-CM | POA: Insufficient documentation

## 2020-12-09 DIAGNOSIS — R351 Nocturia: Secondary | ICD-10-CM | POA: Insufficient documentation

## 2020-12-09 DIAGNOSIS — Z7984 Long term (current) use of oral hypoglycemic drugs: Secondary | ICD-10-CM | POA: Insufficient documentation

## 2020-12-09 DIAGNOSIS — E1122 Type 2 diabetes mellitus with diabetic chronic kidney disease: Secondary | ICD-10-CM | POA: Diagnosis not present

## 2020-12-09 DIAGNOSIS — Z79899 Other long term (current) drug therapy: Secondary | ICD-10-CM | POA: Insufficient documentation

## 2020-12-09 DIAGNOSIS — Z87891 Personal history of nicotine dependence: Secondary | ICD-10-CM | POA: Diagnosis not present

## 2020-12-09 DIAGNOSIS — Z885 Allergy status to narcotic agent status: Secondary | ICD-10-CM | POA: Insufficient documentation

## 2020-12-09 DIAGNOSIS — Z794 Long term (current) use of insulin: Secondary | ICD-10-CM | POA: Diagnosis not present

## 2020-12-09 DIAGNOSIS — Z882 Allergy status to sulfonamides status: Secondary | ICD-10-CM | POA: Insufficient documentation

## 2020-12-09 DIAGNOSIS — N401 Enlarged prostate with lower urinary tract symptoms: Secondary | ICD-10-CM | POA: Insufficient documentation

## 2020-12-09 DIAGNOSIS — Z7982 Long term (current) use of aspirin: Secondary | ICD-10-CM | POA: Diagnosis not present

## 2020-12-09 DIAGNOSIS — N471 Phimosis: Secondary | ICD-10-CM | POA: Insufficient documentation

## 2020-12-09 DIAGNOSIS — Z96641 Presence of right artificial hip joint: Secondary | ICD-10-CM | POA: Insufficient documentation

## 2020-12-09 DIAGNOSIS — I129 Hypertensive chronic kidney disease with stage 1 through stage 4 chronic kidney disease, or unspecified chronic kidney disease: Secondary | ICD-10-CM | POA: Diagnosis not present

## 2020-12-09 DIAGNOSIS — I10 Essential (primary) hypertension: Secondary | ICD-10-CM | POA: Diagnosis not present

## 2020-12-09 DIAGNOSIS — R350 Frequency of micturition: Secondary | ICD-10-CM | POA: Insufficient documentation

## 2020-12-09 DIAGNOSIS — N183 Chronic kidney disease, stage 3 unspecified: Secondary | ICD-10-CM | POA: Diagnosis not present

## 2020-12-09 HISTORY — DX: Benign prostatic hyperplasia with lower urinary tract symptoms: N40.1

## 2020-12-09 HISTORY — DX: Essential tremor: G25.0

## 2020-12-09 HISTORY — DX: Presence of external hearing-aid: Z97.4

## 2020-12-09 HISTORY — DX: Unspecified osteoarthritis, unspecified site: M19.90

## 2020-12-09 HISTORY — DX: Type 2 diabetes mellitus without complications: E11.9

## 2020-12-09 HISTORY — DX: Personal history of other diseases of male genital organs: Z87.438

## 2020-12-09 HISTORY — DX: Hyperlipidemia, unspecified: E78.5

## 2020-12-09 HISTORY — DX: Long term (current) use of insulin: Z79.4

## 2020-12-09 HISTORY — DX: Hypothyroidism, unspecified: E03.9

## 2020-12-09 HISTORY — DX: Chronic kidney disease, stage 3 unspecified: N18.30

## 2020-12-09 HISTORY — DX: Phimosis: N47.1

## 2020-12-09 HISTORY — PX: CIRCUMCISION: SHX1350

## 2020-12-09 HISTORY — DX: Personal history of other specified conditions: Z87.898

## 2020-12-09 LAB — POCT I-STAT, CHEM 8
BUN: 22 mg/dL (ref 8–23)
Calcium, Ion: 1.21 mmol/L (ref 1.15–1.40)
Chloride: 105 mmol/L (ref 98–111)
Creatinine, Ser: 1.2 mg/dL (ref 0.61–1.24)
Glucose, Bld: 155 mg/dL — ABNORMAL HIGH (ref 70–99)
HCT: 44 % (ref 39.0–52.0)
Hemoglobin: 15 g/dL (ref 13.0–17.0)
Potassium: 4.4 mmol/L (ref 3.5–5.1)
Sodium: 139 mmol/L (ref 135–145)
TCO2: 23 mmol/L (ref 22–32)

## 2020-12-09 LAB — GLUCOSE, CAPILLARY: Glucose-Capillary: 135 mg/dL — ABNORMAL HIGH (ref 70–99)

## 2020-12-09 SURGERY — CIRCUMCISION, ADULT
Anesthesia: General | Site: Penis

## 2020-12-09 MED ORDER — FENTANYL CITRATE (PF) 100 MCG/2ML IJ SOLN
INTRAMUSCULAR | Status: DC | PRN
  Administered 2020-12-09: 50 ug via INTRAVENOUS

## 2020-12-09 MED ORDER — CEFAZOLIN SODIUM-DEXTROSE 2-4 GM/100ML-% IV SOLN
INTRAVENOUS | Status: AC
  Filled 2020-12-09: qty 100

## 2020-12-09 MED ORDER — DOCUSATE SODIUM 100 MG PO CAPS: 100.0000 mg | ORAL_CAPSULE | Freq: Every day | ORAL | 0 refills | Status: DC | PRN

## 2020-12-09 MED ORDER — EPHEDRINE 5 MG/ML INJ
INTRAVENOUS | Status: AC
  Filled 2020-12-09: qty 10

## 2020-12-09 MED ORDER — ACETAMINOPHEN 500 MG PO TABS
ORAL_TABLET | ORAL | Status: AC
  Filled 2020-12-09: qty 2

## 2020-12-09 MED ORDER — ACETAMINOPHEN 500 MG PO TABS
1000.0000 mg | ORAL_TABLET | Freq: Once | ORAL | Status: AC
  Administered 2020-12-09: 1000 mg via ORAL

## 2020-12-09 MED ORDER — CEFAZOLIN SODIUM-DEXTROSE 2-4 GM/100ML-% IV SOLN
2.0000 g | INTRAVENOUS | Status: AC
  Administered 2020-12-09: 2 g via INTRAVENOUS

## 2020-12-09 MED ORDER — FENTANYL CITRATE (PF) 100 MCG/2ML IJ SOLN: 25.0000 ug | INTRAMUSCULAR | Status: DC | PRN

## 2020-12-09 MED ORDER — PROPOFOL 10 MG/ML IV BOLUS
INTRAVENOUS | Status: DC | PRN
  Administered 2020-12-09: 120 mg via INTRAVENOUS

## 2020-12-09 MED ORDER — 0.9 % SODIUM CHLORIDE (POUR BTL) OPTIME
TOPICAL | Status: DC | PRN
  Administered 2020-12-09: 500 mL

## 2020-12-09 MED ORDER — FENTANYL CITRATE (PF) 100 MCG/2ML IJ SOLN
INTRAMUSCULAR | Status: AC
  Filled 2020-12-09: qty 2

## 2020-12-09 MED ORDER — BACITRACIN ZINC 500 UNIT/GM EX OINT
TOPICAL_OINTMENT | CUTANEOUS | Status: DC | PRN
  Administered 2020-12-09: 1 via TOPICAL

## 2020-12-09 MED ORDER — ONDANSETRON HCL 4 MG/2ML IJ SOLN
INTRAMUSCULAR | Status: DC | PRN
  Administered 2020-12-09: 4 mg via INTRAVENOUS

## 2020-12-09 MED ORDER — OXYCODONE-ACETAMINOPHEN 5-325 MG PO TABS: 1.0000 | ORAL_TABLET | ORAL | 0 refills | Status: DC | PRN

## 2020-12-09 MED ORDER — LIDOCAINE 2% (20 MG/ML) 5 ML SYRINGE
INTRAMUSCULAR | Status: DC | PRN
  Administered 2020-12-09: 100 mg via INTRAVENOUS

## 2020-12-09 MED ORDER — ONDANSETRON HCL 4 MG/2ML IJ SOLN
INTRAMUSCULAR | Status: AC
  Filled 2020-12-09: qty 2

## 2020-12-09 MED ORDER — BUPIVACAINE HCL 0.5 % IJ SOLN
INTRAMUSCULAR | Status: DC | PRN
  Administered 2020-12-09: 30 mL

## 2020-12-09 MED ORDER — EPHEDRINE SULFATE-NACL 50-0.9 MG/10ML-% IV SOSY
PREFILLED_SYRINGE | INTRAVENOUS | Status: DC | PRN
  Administered 2020-12-09: 10 mg via INTRAVENOUS
  Administered 2020-12-09: 15 mg via INTRAVENOUS
  Administered 2020-12-09: 10 mg via INTRAVENOUS

## 2020-12-09 MED ORDER — PROPOFOL 10 MG/ML IV BOLUS
INTRAVENOUS | Status: AC
  Filled 2020-12-09: qty 20

## 2020-12-09 MED ORDER — ONDANSETRON HCL 4 MG/2ML IJ SOLN: 4.0000 mg | Freq: Once | INTRAMUSCULAR | Status: DC | PRN

## 2020-12-09 MED ORDER — SODIUM CHLORIDE 0.9 % IV SOLN: INTRAVENOUS | Status: DC

## 2020-12-09 MED ORDER — LIDOCAINE HCL (PF) 2 % IJ SOLN
INTRAMUSCULAR | Status: AC
  Filled 2020-12-09: qty 5

## 2020-12-09 MED ORDER — DEXAMETHASONE SODIUM PHOSPHATE 10 MG/ML IJ SOLN
INTRAMUSCULAR | Status: AC
  Filled 2020-12-09: qty 2

## 2020-12-09 SURGICAL SUPPLY — 31 items
BLADE CLIPPER SENSICLIP SURGIC (BLADE) ×2 IMPLANT
BLADE SURG 15 STRL LF DISP TIS (BLADE) ×1 IMPLANT
BLADE SURG 15 STRL SS (BLADE) ×3
BNDG COHESIVE 1X5 TAN STRL LF (GAUZE/BANDAGES/DRESSINGS) ×3 IMPLANT
BNDG CONFORM 2 STRL LF (GAUZE/BANDAGES/DRESSINGS) ×3 IMPLANT
CORD BIPOLAR FORCEPS 12FT (ELECTRODE) ×3 IMPLANT
COVER BACK TABLE 60X90IN (DRAPES) ×3 IMPLANT
COVER MAYO STAND STRL (DRAPES) ×3 IMPLANT
DRAPE LAPAROTOMY 100X72 PEDS (DRAPES) ×3 IMPLANT
ELECT REM PT RETURN 9FT ADLT (ELECTROSURGICAL) ×3
ELECTRODE REM PT RTRN 9FT ADLT (ELECTROSURGICAL) ×1 IMPLANT
GAUZE 4X4 16PLY ~~LOC~~+RFID DBL (SPONGE) ×3 IMPLANT
GAUZE XEROFORM 1X8 LF (GAUZE/BANDAGES/DRESSINGS) ×3 IMPLANT
GLOVE SURG ENC MOIS LTX SZ7 (GLOVE) ×3 IMPLANT
GLOVE SURG POLYISO LF SZ6 (GLOVE) ×2 IMPLANT
GLOVE SURG UNDER POLY LF SZ6.5 (GLOVE) ×2 IMPLANT
GLOVE SURG UNDER POLY LF SZ7.5 (GLOVE) ×3 IMPLANT
GOWN STRL REUS W/TWL LRG LVL3 (GOWN DISPOSABLE) ×5 IMPLANT
KIT TURNOVER CYSTO (KITS) ×3 IMPLANT
NDL HYPO 25X1 1.5 SAFETY (NEEDLE) IMPLANT
NEEDLE HYPO 25X1 1.5 SAFETY (NEEDLE) ×3 IMPLANT
NS IRRIG 500ML POUR BTL (IV SOLUTION) ×2 IMPLANT
PACK BASIN DAY SURGERY FS (CUSTOM PROCEDURE TRAY) ×3 IMPLANT
PENCIL SMOKE EVACUATOR (MISCELLANEOUS) ×3 IMPLANT
SOL PREP POV-IOD 4OZ 10% (MISCELLANEOUS) ×2 IMPLANT
SUT SILK 0 SH 30 (SUTURE) ×2 IMPLANT
SUT VIC AB 4-0 SH 27 (SUTURE) ×6
SUT VIC AB 4-0 SH 27XANBCTRL (SUTURE) ×2 IMPLANT
SYR CONTROL 10ML LL (SYRINGE) ×2 IMPLANT
TOWEL OR 17X26 10 PK STRL BLUE (TOWEL DISPOSABLE) ×3 IMPLANT
TRAY DSU PREP LF (CUSTOM PROCEDURE TRAY) ×3 IMPLANT

## 2020-12-09 NOTE — Discharge Instructions (Addendum)
Activity:  You are encouraged to ambulate frequently (about every hour during waking hours) to help prevent blood clots from forming in your legs or lungs.  However, you should not engage in any heavy lifting (> 10-15 lbs), strenuous activity, or straining.  Diet: You should advance your diet as instructed by your physician.  It will be normal to have some bloating, nausea, and abdominal discomfort intermittently.  Prescriptions:  You will be provided a prescription for pain medication to take as needed.  If your pain is not severe enough to require the prescription pain medication, you may take extra strength Tylenol instead which will have less side effects.  You should also take a prescribed stool softener to avoid straining with bowel movements as the prescription pain medication may constipate you.  Incisions: You may remove your dressing bandages 48 hours after surgery if not removed in the hospital.  You will either have some small staples or special tissue glue at each of the incision sites. Once the bandages are removed (if present), the incisions may stay open to air.  You may start showering (but not soaking or bathing in water) the 2nd day after surgery and the incisions simply need to be patted dry after the shower.  No additional care is needed.  What to call us about: You should call the office [phone]) if you develop fever > 101 or develop persistent vomiting. Activity:  You are encouraged to ambulate frequently (about every hour during waking hours) to help prevent blood clots from forming in your legs or lungs.  However, you should not engage in any heavy lifting (> 10-15 lbs), strenuous activity, or straining. Circumcision-Home Care Instructions  The following instructions have been prepared to help you care for yourself upon your return home today.   Wound Care & Hygiene:   You may apply ice to the penis.  This may help to decrease swelling.  Remove the dressing tomorrow.  If  the dressing falls off before then, leave it off.  You may shower or bathe in 48 hours  Gently wash the penis with soap and water.  The stitches do not need to be removed.  Activity:  Do not drive or operate any equipment today.  The effects of anesthesia are still present, drowsiness may result.  Rest today, not necessarily flat bed rest, just take it easy.  You may resume your normal activity in one to two days or as indicated by your physician.  Sexual Activity:  Erection and sexual relations should be avoided for *2 weeks.  Return to Work:  One to two days or as indicated by your physician.  Diet:  Drink liquids or eat a very light diet this evening.  You may resume a regular diet tomorrow.  General Expectations of your surgery:  You may have a small amount of bleeding The penis will be swollen and bruised for approximately one week You may wake during the night with an erection, usually this is caused by having a full bladder so you should try to urinate (pass your water) to relieve the erection or apply ice to the penis  Unexpected Observations - Call your doctor if these occur! Persistent or heavy bleeding Temperature of 101 degrees or more Severe pain not relieved by medication   Post Anesthesia Home Care Instructions  Activity: Get plenty of rest for the remainder of the day. A responsible individual must stay with you for 24 hours following the procedure.  For the next 24  hours, DO NOT: -Drive a car -Paediatric nurse -Drink alcoholic beverages -Take any medication unless instructed by your physician -Make any legal decisions or sign important papers.  Meals: Start with liquid foods such as gelatin or soup. Progress to regular foods as tolerated. Avoid greasy, spicy, heavy foods. If nausea and/or vomiting occur, drink only clear liquids until the nausea and/or vomiting subsides. Call your physician if vomiting continues.  Special Instructions/Symptoms: Your throat  may feel dry or sore from the anesthesia or the breathing tube placed in your throat during surgery. If this causes discomfort, gargle with warm salt water. The discomfort should disappear within 24 hours.

## 2020-12-09 NOTE — Anesthesia Preprocedure Evaluation (Addendum)
Anesthesia Evaluation  Patient identified by MRN, date of birth, ID band Patient awake    Reviewed: Allergy & Precautions, NPO status , Patient's Chart, lab work & pertinent test results, reviewed documented beta blocker date and time   Airway Mallampati: II  TM Distance: >3 FB Neck ROM: Full    Dental  (+) Teeth Intact, Dental Advisory Given   Pulmonary former smoker,    Pulmonary exam normal breath sounds clear to auscultation       Cardiovascular hypertension, Pt. on medications and Pt. on home beta blockers Normal cardiovascular exam Rhythm:Regular Rate:Normal     Neuro/Psych Essential tremor     GI/Hepatic negative GI ROS, Neg liver ROS,   Endo/Other  diabetes, Type 2, Oral Hypoglycemic Agents, Insulin DependentHypothyroidism Obesity   Renal/GU Renal InsufficiencyRenal disease   PHIMOSIS Recurrent cystitis    Musculoskeletal  (+) Arthritis ,   Abdominal   Peds  Hematology negative hematology ROS (+)   Anesthesia Other Findings   Reproductive/Obstetrics                            Anesthesia Physical Anesthesia Plan  ASA: 3  Anesthesia Plan: General   Post-op Pain Management:    Induction: Intravenous  PONV Risk Score and Plan: 3 and Dexamethasone, Ondansetron and Treatment may vary due to age or medical condition  Airway Management Planned: LMA  Additional Equipment:   Intra-op Plan:   Post-operative Plan: Extubation in OR  Informed Consent: I have reviewed the patients History and Physical, chart, labs and discussed the procedure including the risks, benefits and alternatives for the proposed anesthesia with the patient or authorized representative who has indicated his/her understanding and acceptance.     Dental advisory given  Plan Discussed with: CRNA  Anesthesia Plan Comments:        Anesthesia Quick Evaluation

## 2020-12-09 NOTE — Anesthesia Postprocedure Evaluation (Signed)
Anesthesia Post Note  Patient: [name]  Procedure(s) Performed: CIRCUMCISION ADULT WITH FRENULOPLASTY/ DORSAL NERVE BLOCK (Penis)     Patient location during evaluation: PACU Anesthesia Type: General Level of consciousness: awake and alert Pain management: pain level controlled Vital Signs Assessment: post-procedure vital signs reviewed and stable Respiratory status: spontaneous breathing, nonlabored ventilation, respiratory function stable and patient connected to nasal cannula oxygen Cardiovascular status: blood pressure returned to baseline and stable Postop Assessment: no apparent nausea or vomiting Anesthetic complications: no   No notable events documented.  Last Vitals:  Vitals:   12/09/20 1500 12/09/20 1515  BP: (!) 148/80 (!) 152/77  Pulse: (!) 55 (!) 54  Resp: 18 16  Temp: 36.4 C 36.4 C  SpO2: 93% 92%    Last Pain:  Vitals:   12/09/20 1515  TempSrc:   PainSc: 0-No pain                 Barnet Glasgow

## 2020-12-09 NOTE — Transfer of Care (Signed)
Immediate Anesthesia Transfer of Care Note  Patient: MACHAEL GHOLAR  Procedure(s) Performed: Procedure(s) (LRB): CIRCUMCISION ADULT WITH FRENULOPLASTY/ DORSAL NERVE BLOCK (N/A)  Patient Location: PACU  Anesthesia Type: General  Level of Consciousness: awake, oriented, sedated and patient cooperative  Airway & Oxygen Therapy: Patient Spontanous Breathing and Patient connected to face mask oxygen  Post-op Assessment: Report given to PACU RN and Post -op Vital signs reviewed and stable  Post vital signs: Reviewed and stable  Complications: No apparent anesthesia complications Last Vitals:  Vitals Value Taken Time  BP 140/105 12/09/20 1445  Temp    Pulse 53 12/09/20 1445  Resp 18 12/09/20 1445  SpO2 94 % 12/09/20 1445  Vitals shown include unvalidated device data.  Last Pain:  Vitals:   12/09/20 1154  TempSrc: Oral  PainSc: 0-No pain      Patients Stated Pain Goal: 5 ([id number])  Complications: No notable events documented.

## 2020-12-09 NOTE — H&P (Signed)
CC/HPI: [name] is a 75 year old male seen in follow-up with a history of BPH with LUTS and phimosis.   History of phimosis that began in 2020. This is responding well to topical betamethasone 0.05% cream. This initially worked however he is no longer able to retract his foreskin over the glans of his penis. He is now requesting circumcision.   He has had annual PSA screening, most recently 0.17 on 12/27/2019. He denies a family history of prostate cancer. As he is now 14, he elects to forego annual PSA screening in the future.   He has mild lower urinary tract symptoms. IPSS score is 4, quality-of-life 2. He does complain of urinary frequency, urgency, 2 times nocturia. PVR today is 7 mL. He does take doxazosin 2 mg daily for hypertension.   Patient currently denies fever, chills, sweats, nausea, vomiting, abdominal or flank pain, gross hematuria or dysuria.   He denies significant cardiac or pulmonary history. He takes ASA.     ALLERGIES: Morphine Derivatives Sulfa Drugs    MEDICATIONS: Amlodipine Besylate 5 mg tablet Oral  Aspirin 325 MG Oral Tablet Oral  Betamethasone Dipropionate 0.05 % cream Apply a small amount to tightest area of the foreskin twice daily.  Doxazosin Mesylate 2 mg tablet Oral  Glipizide Er 5 mg tablet, extended release 24 hr Oral  Glucosamine-Chondroitin Oral Tablet Oral  Januvia 100 MG Oral Tablet Oral  Levemir FlexPen 100 UNIT/ML SOLN Subcutaneous  Lisinopril-Hydrochlorothiazide 20-12.5 MG Oral Tablet Oral  Metformin Hcl 1,000 mg tablet Oral  Multi-Vitamin TABS Oral  Oxycodone-Acetaminophen 5-325 MG Oral Tablet Oral  Simvastatin 40 mg tablet Oral  Ultra-Fine Short Pen Needle 31 gauge x 5/16" needle, disposable Does Not Apply  Vitamin D TABS Oral     GU PSH: None     PSH Notes: Total Hip Replacement, Back Surgery, Tonsillectomy, Hernia Repair   NON-GU PSH: Hernia Repair - 2014 Remove Tonsils - 2014 Total Hip Replacement - 2014     GU PMH:  BPH w/LUTS - 07/30/2020, - 01/03/2020 Nocturia - 07/30/2020, - 01/03/2020 Phimosis - 07/30/2020, - 01/03/2020 (Worsening), He has redeveloped a mild phimosis. This has responded to betamethasone in the past. I have refilled his prescription., - 2019, He has mild phimosis. We discussed the options which would be to try a course of topical steroids to see if this softens the skin enough to allow him to retract the foreskin without difficulty. We did discuss circumcision as an alternative means of managing this if it should not responding to conservative therapy., - 2018 Encounter for Prostate Cancer screening, His prostate is noted to be enlarged but it is smooth and benign. His PSA at 0.19 remains quite normal and therefore I will see him back on a yearly basis for - 2018 Acute Cystitis/UTI, He has no further symptoms to suggest a UTI or prostatitis with clear urine today. - 2017 Weak Urinary Stream (Worsening), He has a history of BPH with outlet obstruction. He indicates that his prostate has been examined in the past year. I will reinitiate alpha blockade therapy and see him back on a yearly basis for DRE and PSA. - 2017 Chronic cystitis (w/o hematuria), Chronic cystitis - 2014 Chronic prostatitis, Prostatitis, chronic - 2014 ED due to arterial insufficiency, Erectile dysfunction due to arterial insufficiency - 2014 Renal cyst, Renal cyst, acquired, left - 2014      PMH Notes: Recurrent cystitis: The patient was admitted to the hospital on 06/25/12 with a UTI and of course diagnosed with  urosepsis. Urine culture was positive for Escherichia coli however blood cultures x2 were negative. A renal ultrasound revealed no abnormality the kidneys and specifically no hydronephrosis. It appears he had an Escherichia coli UTI in 2/14 as well. The sensitivity patterns were identical indicating persistent infection rather than reinfection.   LUTS: He has some mild urgency and nocturia 2-3 times. He said that has been  his voiding pattern for many years.   Left renal cyst: These were noted on a renal ultrasound done 06/28/12. No other abnormality was noted on that study.     NON-GU PMH: Pyuria/other UA findings, He had some mild pyuria today but did not have significant bacteriuria. I think it may be contamination but I will culture the urine. - 2018 Gout, Gout - 2014 Personal history of other diseases of the circulatory system, History of hypertension - 2014 Personal history of other endocrine, nutritional and metabolic disease, History of hypercholesterolemia - 2014, History of diabetes mellitus, - 2014 Encounter for general adult medical examination without abnormal findings, Encounter for preventive health examination    FAMILY HISTORY: Death In The Family Father - Runs In Family Death In The Family Mother - Runs In Family Diabetes - Mother Family Health Status Number - Runs In Family Heart Disease - Sister Hematuria - Mother nephrolithiasis - Sister renal failure - Mother Stroke Syndrome - Father   SOCIAL HISTORY: Marital Status: Married Preferred Language: English; Ethnicity: Not Hispanic Or Latino; Race: White Current Smoking Status: Patient does not smoke anymore.  Does not drink anymore.  Drinks 2 caffeinated drinks per day.     Notes: Former smoker, Alcohol Use, Caffeine Use, Marital History - Currently Married   REVIEW OF SYSTEMS:    GU Review Male:   Patient denies frequent urination, hard to postpone urination, burning/ pain with urination, get up at night to urinate, leakage of urine, stream starts and stops, trouble starting your stream, have to strain to urinate , erection problems, and penile pain.  Gastrointestinal (Upper):   Patient denies nausea, vomiting, and indigestion/ heartburn.  Gastrointestinal (Lower):   Patient denies diarrhea and constipation.  Constitutional:   Patient denies fever, night sweats, weight loss, and fatigue.  Skin:   Patient denies skin rash/ lesion and  itching.  Eyes:   Patient denies blurred vision and double vision.  Ears/ Nose/ Throat:   Patient denies sore throat and sinus problems.  Hematologic/Lymphatic:   Patient denies swollen glands and easy bruising.  Cardiovascular:   Patient denies leg swelling and chest pains.  Respiratory:   Patient denies cough and shortness of breath.  Endocrine:   Patient denies excessive thirst.  Musculoskeletal:   Patient denies back pain and joint pain.  Neurological:   Patient denies headaches and dizziness.  Psychologic:   Patient denies depression and anxiety.   VITAL SIGNS: None   GU PHYSICAL EXAMINATION:    Scrotum: No lesions. No edema. No cysts. No warts.  Epididymides: Right: no spermatocele, no masses, no cysts, no tenderness, no induration, no enlargement. Left: no spermatocele, no masses, no cysts, no tenderness, no induration, no enlargement.  Testes: No tenderness, no swelling, no enlargement left testes. No tenderness, no swelling, no enlargement right testes. Normal location left testes. Normal location right testes. No mass, no cyst, no varicocele, no hydrocele left testes. No mass, no cyst, no varicocele, no hydrocele right testes.  Urethral Meatus: Normal size. No lesion, no wart, no discharge, no polyp. Normal location.  Penis: Uncircumcised, tight phimotic band, unable to  reduce foreskin over glans  Sphincter Tone: Normal sphincter. No rectal tenderness. No rectal mass.    MULTI-SYSTEM PHYSICAL EXAMINATION:    Constitutional: Well-nourished. No physical deformities. Normally developed. Good grooming.  Respiratory: No labored breathing, no use of accessory muscles.   Cardiovascular: Normal temperature, normal extremity pulses, no swelling, no varicosities.  Gastrointestinal: No mass, no tenderness, no rigidity, obese     Complexity of Data:  Source Of History:  Patient, Medical Record Summary  Lab Test Review:   PSA  Records Review:   AUA Symptom Score  Urine Test Review:    Urinalysis   12/27/19 12/22/18 11/24/17 11/10/16  PSA  Total PSA 0.17 ng/mL 0.14 ng/mL 0.57 ng/mL 0.19 ng/mL    PROCEDURES:          Urinalysis Dipstick Dipstick Cont'd  Color: Yellow Bilirubin: Neg mg/dL  Appearance: Clear Ketones: Neg mg/dL  Specific Gravity: 1.020 Blood: Neg ery/uL  pH: <=5.0 Protein: Neg mg/dL  Glucose: 2+ mg/dL Urobilinogen: 0.2 mg/dL    Nitrites: Neg    Leukocyte Esterase: Neg leu/uL    ASSESSMENT:      ICD-10 Details  1 GU:   BPH w/LUTS - N40.1   2   Phimosis - N47.1    PLAN:            Medications Refill Meds: Betamethasone Dipropionate 0.05 % cream Apply a small amount to tightest area of the foreskin twice daily.   #60  1 Refill(s)            Document Letter(s):  Created for Patient: Clinical Summary         Notes:   1. Phimosis: He is now unable to retract the foreskin over the glans of his penis. We discussed options and he elected proceed with circumcision. Discussed risk and benefits including bleeding and infection. He is requesting refill of betamethasone today before surgery can be scheduled.   2. BPH with LUTS: Mild symptoms. IPSS 4, 2. PVR 7 mL. Continue doxazosin.   #3. Prostate cancer screening: Last PSA 12/27/2019 was normal at 0.17. He elects to forego annual PSA screening in future given his age.   CC: Kevin Dad, PA         Next Appointment:      Next Appointment: 01/30/2021 09:45 AM    Appointment Type: Office Visit Established Patient    Location: [facility] [phone]    Provider: Rexene Alberts, M.D.    Reason for Visit: 24moov    Urology Preoperative H&P   Chief Complaint: Phimosis  History of Present Illness: Kevin ARDUINIis a 75y.o. male with phimosis here for circumcision.    Past Medical History:  Diagnosis Date   Benign localized prostatic hyperplasia with lower urinary tract symptoms (LUTS)    CKD (chronic kidney disease), stage III (HCC)    followed by pcp   ED (erectile  dysfunction)    Essential tremor    History of chronic prostatitis    History of colonic polyps    History of urinary retention    Hyperlipidemia    Hypertension    followed by pcp   Hypothyroidism    followed by pcp   OA (osteoarthritis)    knees   Phimosis    Type 2 diabetes mellitus treated with insulin (HBenson    followed by pcp and endocrinologist--- dr w. sTamala Julian  (12-05-2020  per pt checks blood sugar daily in am,  fasting surgery-- 85--120)  Wears hearing aid in both ears     Past Surgical History:  Procedure Laterality Date   CATARACT EXTRACTION W/ INTRAOCULAR LENS IMPLANT  2012   COLONOSCOPY     lasr one approx 2017   Delaplaine   LUMBAR LAMINECTOMY  07/27/2007   '@[facility]'$   by dr Gladstone Lighter;   L3-4  and L4-5   TONSILLECTOMY     age 39   TOTAL HIP ARTHROPLASTY Right 02/08/2007   '@[facility]'$  by dr Alvan Dame    Allergies:  Allergies  Allergen Reactions   Sulfa Antibiotics Hives and Shortness Of Breath   Morphine And Related Nausea And Vomiting    History reviewed. No pertinent family history.  Social History:  reports that he quit smoking about 27 years ago. His smoking use included cigarettes. He has never used smokeless tobacco. He reports that he does not drink alcohol and does not use drugs.  ROS: A complete review of systems was performed.  All systems are negative except for pertinent findings as noted.  Physical Exam:  Vital signs in last 24 hours:   Constitutional:  Alert and oriented, No acute distress Cardiovascular: Regular rate and rhythm Respiratory: Normal respiratory effort, Lungs clear bilaterally GI: Abdomen is soft, nontender, nondistended, no abdominal masses GU: No CVA tenderness Lymphatic: No lymphadenopathy Neurologic: Grossly intact, no focal deficits Psychiatric: Normal mood and affect  Laboratory Data:  No results for input(s): WBC, HGB, HCT, PLT in the last 72 hours.  No results for input(s): NA, K, CL, GLUCOSE, BUN, CALCIUM, CREATININE in  the last 72 hours.  Invalid input(s): CO3   No results found for this or any previous visit (from the past 24 hour(s)). No results found for this or any previous visit (from the past 240 hour(s)).  Renal Function: No results for input(s): CREATININE in the last 168 hours. CrCl cannot be calculated (Patient's most recent lab result is older than the maximum 21 days allowed.).  Radiologic Imaging: No results found.  I independently reviewed the above imaging studies.  Assessment and Plan MOHD. WIRTS is a 75 y.o. male with phimosis here for circumcision.   Matt R. Jalise Zawistowski MD 12/09/2020, 10:32 AM  [facility] Pager: [phone]

## 2020-12-09 NOTE — Anesthesia Procedure Notes (Signed)
Procedure Name: LMA Insertion Date/Time: 12/09/2020 1:41 PM Performed by: Suan Halter, CRNA Pre-anesthesia Checklist: Patient identified, Emergency Drugs available, Suction available and Patient being monitored Patient Re-evaluated:Patient Re-evaluated prior to induction Oxygen Delivery Method: Circle system utilized Preoxygenation: Pre-oxygenation with 100% oxygen Induction Type: IV induction Ventilation: Mask ventilation without difficulty LMA: LMA inserted LMA Size: 5.0 Number of attempts: 1 Airway Equipment and Method: Bite block Placement Confirmation: positive ETCO2 Tube secured with: Tape Dental Injury: Teeth and Oropharynx as per pre-operative assessment

## 2020-12-09 NOTE — Op Note (Signed)
Operative Note  Preoperative diagnosis:  1.  Phimosis  Postoperative diagnosis: 1.  Phimosis  Procedure(s): 1.  Circumcision with frenuloplasty 2. Dorsal penile nerve block  Surgeon: Rexene Alberts, MD  Assistants:  None  Anesthesia:  General  Complications:  None  EBL:  <34m  Specimens: 1. None  Drains/Catheters: 1.  none  Intraoperative findings:   Phimosis Uncomplicated circumcision  Indication:  Kevin OHis a 75y.o. male who presented with phimosis and after being counseled with options he elected to have circumcision.   Description of procedure: The patient was brought back to the operating room, placed on table in a supine position.  He had bilateral sequential compression devices placed and received a dose of Ancef 2 gr preoperatively for antibiotic prophylaxis.  He was placed under general LM anesthesia and then standard prep was performed.  He was prepped and draped in standard sterile fashion.  Surgical time-out was performed identifying the correct patient, procedure, and all were in agreement.   A dorsal penile nerve block was given with .25% Marcaine plain, a total of 212mused. The prepuce was retracted and a subcoronal incision made at 1 cm from sulcus. Hemostasis was obtained with bipolar. The frenulum was reapproximated with a running and interrupted 4-0 vicryl. Hemostats were used to provide traction on the redundant prepuce and a dorsal slit was made with mets the incise the appropriate length of skin for removal. An anchoring interrupted 4-0 vicryl was placed reapproximate the skin, leaving the tail long to provide traction. Scissors were used to make a ventral slit and another vicryl was placed at this location. An incision was made on the skin between these two vicryls. The tissue was then removed with cautery to control any blood vessels at the dartos fascia. Procedure was repeated on the other side, incising the skin and removing with cautery.  Hemostasis was intact. Interrupted 4-0 vicryls were placed in interrupted fashion. Additional vicryls were placed at the frenulum for hemostasis. Satisfied with the cosmesis, we finalized the case with the dressing; Xerform, covered very loosely with a cling and coban dressing.  Plan:  The patient was awoken from general anesthesia, transferred to the PACU in stable condition.  He will be recovered in the PACU and then discharged.  Matt R. [facility]  Pager: [phone]

## 2020-12-10 ENCOUNTER — Encounter (HOSPITAL_BASED_OUTPATIENT_CLINIC_OR_DEPARTMENT_OTHER): Payer: Self-pay | Admitting: Urology

## 2020-12-23 DIAGNOSIS — N401 Enlarged prostate with lower urinary tract symptoms: Secondary | ICD-10-CM | POA: Diagnosis not present

## 2020-12-23 DIAGNOSIS — R351 Nocturia: Secondary | ICD-10-CM | POA: Diagnosis not present

## 2020-12-23 DIAGNOSIS — N471 Phimosis: Secondary | ICD-10-CM | POA: Diagnosis not present

## 2021-01-02 DIAGNOSIS — H5203 Hypermetropia, bilateral: Secondary | ICD-10-CM | POA: Diagnosis not present

## 2021-01-02 DIAGNOSIS — H02401 Unspecified ptosis of right eyelid: Secondary | ICD-10-CM | POA: Diagnosis not present

## 2021-01-02 DIAGNOSIS — E119 Type 2 diabetes mellitus without complications: Secondary | ICD-10-CM | POA: Diagnosis not present

## 2021-01-02 DIAGNOSIS — H43813 Vitreous degeneration, bilateral: Secondary | ICD-10-CM | POA: Diagnosis not present

## 2021-01-16 DIAGNOSIS — N471 Phimosis: Secondary | ICD-10-CM | POA: Diagnosis not present

## 2021-01-16 DIAGNOSIS — N401 Enlarged prostate with lower urinary tract symptoms: Secondary | ICD-10-CM | POA: Diagnosis not present

## 2021-01-16 DIAGNOSIS — R351 Nocturia: Secondary | ICD-10-CM | POA: Diagnosis not present

## 2021-03-10 DIAGNOSIS — K5730 Diverticulosis of large intestine without perforation or abscess without bleeding: Secondary | ICD-10-CM | POA: Diagnosis not present

## 2021-03-10 DIAGNOSIS — Z86010 Personal history of colonic polyps: Secondary | ICD-10-CM | POA: Diagnosis not present

## 2021-03-10 DIAGNOSIS — D123 Benign neoplasm of transverse colon: Secondary | ICD-10-CM | POA: Diagnosis not present

## 2021-03-10 DIAGNOSIS — D122 Benign neoplasm of ascending colon: Secondary | ICD-10-CM | POA: Diagnosis not present

## 2021-03-12 DIAGNOSIS — D123 Benign neoplasm of transverse colon: Secondary | ICD-10-CM | POA: Diagnosis not present

## 2021-03-12 DIAGNOSIS — D122 Benign neoplasm of ascending colon: Secondary | ICD-10-CM | POA: Diagnosis not present

## 2021-03-13 ENCOUNTER — Encounter (HOSPITAL_COMMUNITY): Payer: Self-pay | Admitting: Emergency Medicine

## 2021-03-13 ENCOUNTER — Emergency Department (HOSPITAL_COMMUNITY): Payer: PPO

## 2021-03-13 ENCOUNTER — Emergency Department (HOSPITAL_COMMUNITY)
Admission: EM | Admit: 2021-03-13 | Discharge: 2021-03-14 | Disposition: A | Discharge status: Home / Self Care | Payer: PPO | Attending: Emergency Medicine | Admitting: Emergency Medicine

## 2021-03-13 ENCOUNTER — Other Ambulatory Visit: Payer: Self-pay

## 2021-03-13 DIAGNOSIS — Z7982 Long term (current) use of aspirin: Secondary | ICD-10-CM | POA: Diagnosis not present

## 2021-03-13 DIAGNOSIS — Z96641 Presence of right artificial hip joint: Secondary | ICD-10-CM | POA: Insufficient documentation

## 2021-03-13 DIAGNOSIS — I129 Hypertensive chronic kidney disease with stage 1 through stage 4 chronic kidney disease, or unspecified chronic kidney disease: Secondary | ICD-10-CM | POA: Insufficient documentation

## 2021-03-13 DIAGNOSIS — Y9301 Activity, walking, marching and hiking: Secondary | ICD-10-CM | POA: Diagnosis not present

## 2021-03-13 DIAGNOSIS — Z7984 Long term (current) use of oral hypoglycemic drugs: Secondary | ICD-10-CM | POA: Diagnosis not present

## 2021-03-13 DIAGNOSIS — Z79899 Other long term (current) drug therapy: Secondary | ICD-10-CM | POA: Diagnosis not present

## 2021-03-13 DIAGNOSIS — N183 Chronic kidney disease, stage 3 unspecified: Secondary | ICD-10-CM | POA: Diagnosis not present

## 2021-03-13 DIAGNOSIS — W010XXA Fall on same level from slipping, tripping and stumbling without subsequent striking against object, initial encounter: Secondary | ICD-10-CM | POA: Insufficient documentation

## 2021-03-13 DIAGNOSIS — E1122 Type 2 diabetes mellitus with diabetic chronic kidney disease: Secondary | ICD-10-CM | POA: Insufficient documentation

## 2021-03-13 DIAGNOSIS — W19XXXA Unspecified fall, initial encounter: Secondary | ICD-10-CM

## 2021-03-13 DIAGNOSIS — S42332A Displaced oblique fracture of shaft of humerus, left arm, initial encounter for closed fracture: Secondary | ICD-10-CM | POA: Diagnosis not present

## 2021-03-13 DIAGNOSIS — Z87891 Personal history of nicotine dependence: Secondary | ICD-10-CM | POA: Insufficient documentation

## 2021-03-13 DIAGNOSIS — E039 Hypothyroidism, unspecified: Secondary | ICD-10-CM | POA: Diagnosis not present

## 2021-03-13 DIAGNOSIS — S4992XA Unspecified injury of left shoulder and upper arm, initial encounter: Secondary | ICD-10-CM | POA: Diagnosis present

## 2021-03-13 MED ORDER — HYDROCODONE-ACETAMINOPHEN 5-325 MG PO TABS
1.0000 | ORAL_TABLET | Freq: Once | ORAL | Status: AC
  Administered 2021-03-13: 1 via ORAL
  Filled 2021-03-13: qty 1

## 2021-03-13 MED ORDER — HYDROCODONE-ACETAMINOPHEN 5-325 MG PO TABS: 1.0000 | ORAL_TABLET | ORAL | 0 refills | Status: DC | PRN

## 2021-03-13 NOTE — ED Triage Notes (Signed)
Patient is complaining of falling at home. Patient has a laceration in the middle of forehead. Patient states that he hit his head on the door casing. Patient is complaining of mid left arm pain.

## 2021-03-13 NOTE — Discharge Instructions (Signed)
You can remove the sling as needed for bathing as needed.  Do not remove the splint.  Use ice on the sore area 3-4 times a day for several days.  Call the orthopedic doctor in the morning to arrange for follow-up care and treatment.  Do not drive a car.

## 2021-03-13 NOTE — ED Provider Notes (Signed)
[facility] DEPT Provider Note   CSN: [id number] Arrival date & time: 03/13/21  2135     History Chief Complaint  Patient presents with   [name] is a 75 y.o. male.  HPI Patient states he was walking in his home when he fell while carrying garbage.  He feels that he tripped on something.  He was able to get up with help from EMS and walk.  He complains of pain only in the left mid humerus area.  He denies chest pain, rib pain, headache, neck pain, weakness or dizziness.  He does not take anticoagulants.  There are no other known active modifying factors    Past Medical History:  Diagnosis Date   Benign localized prostatic hyperplasia with lower urinary tract symptoms (LUTS)    CKD (chronic kidney disease), stage III ([place])    followed by pcp   ED (erectile dysfunction)    Essential tremor    History of chronic prostatitis    History of colonic polyps    History of urinary retention    Hyperlipidemia    Hypertension    followed by pcp   Hypothyroidism    followed by pcp   OA (osteoarthritis)    knees   Phimosis    Type 2 diabetes mellitus treated with insulin ([place])    followed by pcp and endocrinologist--- dr w. Tamala Julian   (12-05-2020  per pt checks blood sugar daily in am,  fasting surgery-- 85--120)   Wears hearing aid in both ears     Patient Active Problem List   Diagnosis Date Noted   Anemia of chronic disease 06/30/2012   Personal history of noncompliance with medical treatment, presenting hazards to health 04/28/2011   Hyperlipidemia LDL goal <70 09/05/2010   Hypertension associated with diabetes ([place]) 09/05/2010   Obesity (BMI 30-39.9) 09/05/2010    Past Surgical History:  Procedure Laterality Date   CATARACT EXTRACTION W/ INTRAOCULAR LENS IMPLANT  2012   CIRCUMCISION N/A 12/09/2020   Procedure: CIRCUMCISION ADULT WITH FRENULOPLASTY/ DORSAL NERVE BLOCK;  Surgeon: Janith Lima, MD;  Location: [facility];  Service: Urology;  Laterality: N/A;   COLONOSCOPY     lasr one approx 2017   [place]   LUMBAR LAMINECTOMY  07/27/2007   @[facility]   by dr Gladstone Lighter;   L3-4  and L4-5   TONSILLECTOMY     age 45   TOTAL HIP ARTHROPLASTY Right 02/08/2007   @[facility]  by dr Alvan Dame       History reviewed. No pertinent family history.  Social History   Tobacco Use   Smoking status: Former    Years: 35.00    Types: Cigarettes    Quit date: 1995    Years since quitting: 27.9   Smokeless tobacco: Never  Vaping Use   Vaping Use: Never used  Substance Use Topics   Alcohol use: No   Drug use: Never    Home Medications Prior to Admission medications   Medication Sig Start Date End Date Taking? Authorizing Provider  acetaminophen (TYLENOL) 500 MG tablet Take 1,000 mg by mouth daily.    [provider]  Alogliptin Benzoate 25 MG TABS Take 1 tablet by mouth daily.    [provider]  amLODipine (NORVASC) 2.5 MG tablet Take 2.5 mg by mouth daily.    [provider]  aspirin 325 MG tablet Take 325 mg by mouth daily.  [provider]  betamethasone dipropionate 0.05 % cream Apply topically 2 (two) times daily.    [provider]  docusate sodium (COLACE) 100 MG capsule Take 1 capsule (100 mg total) by mouth daily as needed for up to 30 doses. 12/09/20   Janith Lima, MD  doxazosin (CARDURA) 4 MG tablet Take 4 mg by mouth at bedtime.    [provider]  empagliflozin (JARDIANCE) 25 MG TABS tablet Take 25 mg by mouth daily.    [provider]  glipiZIDE (GLUCOTROL) 5 MG tablet Take 5 mg by mouth 2 (two) times daily before a meal.    [provider]  Glucosamine-Chondroit-Vit C-Mn (GLUCOSAMINE CHONDROITIN COMPLX) CAPS Take 1 capsule by mouth 2 (two) times daily.    [provider]  insulin detemir (LEVEMIR) 100 UNIT/ML injection Inject 52 Units into the skin at bedtime.    [provider]  levothyroxine  (SYNTHROID) 50 MCG tablet Take 50 mcg by mouth daily before breakfast.    [provider]  lisinopril (ZESTRIL) 20 MG tablet Take 20 mg by mouth daily.    [provider]  losartan (COZAAR) 100 MG tablet Take 100 mg by mouth at bedtime.    [provider]  metFORMIN (GLUMETZA) 500 MG (MOD) 24 hr tablet Take 500 mg by mouth 2 (two) times daily with a meal.    [provider]  oxyCODONE-acetaminophen (PERCOCET) 5-325 MG tablet Take 1 tablet by mouth every 4 (four) hours as needed for up to 12 doses for severe pain. 12/09/20   Janith Lima, MD  primidone (MYSOLINE) 50 MG tablet Take 50 mg by mouth 2 (two) times daily.    [provider]  propranolol (INDERAL) 20 MG tablet Take 20 mg by mouth 3 (three) times daily.    [provider]  simvastatin (ZOCOR) 40 MG tablet Take 40 mg by mouth at bedtime.    [provider]  tamsulosin (FLOMAX) 0.4 MG CAPS capsule Take 0.4 mg by mouth daily.    [provider]    Allergies    Sulfa antibiotics, Morphine, Morphine and related, and Morphine sulfate  Review of Systems   Review of Systems  All other systems reviewed and are negative.  Physical Exam Updated Vital Signs BP (!) 180/76 (BP Location: Right Arm)    Pulse 88    Temp 97.6 F (36.4 C) (Oral)    Resp 18    Ht 5\' 8"  (1.727 m)    Wt 99.8 kg    SpO2 98%    BMI 33.45 kg/m   Physical Exam Vitals and nursing note reviewed.  Constitutional:      General: He is not in acute distress.    Appearance: He is well-developed. He is not ill-appearing, toxic-appearing or diaphoretic.  HENT:     Head: Normocephalic.     Comments: Mid forehead abrasion, not bleeding.  No associated crepitation, swelling or deformity.    Right Ear: External ear normal.     Left Ear: External ear normal.     Mouth/Throat:     Mouth: Mucous membranes are moist.  Eyes:     Conjunctiva/sclera: Conjunctivae normal.     Pupils: Pupils are equal, round, and  reactive to light.  Neck:     Trachea: Phonation normal.  Cardiovascular:     Rate and Rhythm: Normal rate and regular rhythm.     Heart sounds: Normal heart sounds.  Pulmonary:     Effort: Pulmonary effort  is normal.     Breath sounds: Normal breath sounds.  Abdominal:     General: There is no distension.     Palpations: Abdomen is soft.     Tenderness: There is no abdominal tenderness.  Musculoskeletal:        General: Tenderness (Left mid humerus without deformity) present. No swelling.     Cervical back: Normal range of motion and neck supple.     Comments: Left arm in sling.  Resists moving left upper arm secondary to pain mid humeral region.  No associated skin break.  Skin:    General: Skin is warm and dry.  Neurological:     Mental Status: He is alert and oriented to person, place, and time.     Cranial Nerves: No cranial nerve deficit.     Sensory: No sensory deficit.     Motor: No abnormal muscle tone.     Coordination: Coordination normal.  Psychiatric:        Mood and Affect: Mood normal.        Behavior: Behavior normal.        Thought Content: Thought content normal.        Judgment: Judgment normal.    ED Results / Procedures / Treatments   Labs (all labs ordered are listed, but only abnormal results are displayed) Labs Reviewed - No data to display  EKG None  Radiology DG Elbow 2 Views Left  Result Date: 03/13/2021 CLINICAL DATA:  Status post fall with pain in midshaft of humerus. EXAM: LEFT HUMERUS - 2+ VIEW; LEFT ELBOW - 2 VIEW COMPARISON:  None. FINDINGS: There is a slightly comminuted fracture of the mid left humeral diaphysis with lateral displacement of the distal fracture fragment. No dislocation is seen at the shoulder or elbow. Severe degenerative changes are noted at the right shoulder. There is a lucency in the humeral head on two views and the possibility of fracture can not be excluded. Mild degenerative changes are noted at the  acromioclavicular joint. The soft tissues are within normal limits. IMPRESSION: 1. Slightly comminuted fracture of the mid humeral shaft with lateral displacement of the distal fracture fragment. 2. Severe degenerative changes at the right humeral head. A lucency is also seen in the right humeral head and the possibility of nondisplaced fracture can not be excluded. CT may be beneficial for further evaluation. Electronically Signed   By: Brett Fairy M.D.   On: 03/13/2021 22:44   DG Humerus Left  Result Date: 03/13/2021 CLINICAL DATA:  Status post fall with pain in midshaft of humerus. EXAM: LEFT HUMERUS - 2+ VIEW; LEFT ELBOW - 2 VIEW COMPARISON:  None. FINDINGS: There is a slightly comminuted fracture of the mid left humeral diaphysis with lateral displacement of the distal fracture fragment. No dislocation is seen at the shoulder or elbow. Severe degenerative changes are noted at the right shoulder. There is a lucency in the humeral head on two views and the possibility of fracture can not be excluded. Mild degenerative changes are noted at the acromioclavicular joint. The soft tissues are within normal limits. IMPRESSION: 1. Slightly comminuted fracture of the mid humeral shaft with lateral displacement of the distal fracture fragment. 2. Severe degenerative changes at the right humeral head. A lucency is also seen in the right humeral head and the possibility of nondisplaced fracture can not be excluded. CT may be beneficial for further evaluation. Electronically Signed   By: Brett Fairy M.D.   On: 03/13/2021 22:44  Procedures Procedures   Patient Vitals for the past 24 hrs:  BP Temp Temp src Pulse Resp SpO2 Height Weight  03/13/21 2150 (!) 180/76 97.6 F (36.4 C) Oral 88 18 98 % -- --  03/13/21 2149 -- -- -- -- -- -- 5\' 8"  (1.727 m) 99.8 kg    11:23 PM Reevaluation with update and discussion. After initial assessment and treatment, an updated evaluation reveals no change in clinical  status.  Findings discussed with patient and wife at bedside, all questions answered. Daleen Bo   Medical Decision Making:  This patient is presenting for evaluation of Injury to left upper arm after fall , which does require a range of treatment options, and is a complaint that involves a moderate risk of morbidity and mortality. The differential diagnoses include Contusion, fracture. I decided to review old records, and in summary Elderly male walking, tripped and fell injuring his left upper arm.  He has a minor abrasion of his forehead..  I Obtained additional historical information from - wife at bedside.   Radiologic Tests Ordered, included left humerus.  I independently Visualized: Radiograph images, which show midshaft humeral fracture, mildly displaced    Critical Interventions-clinical evaluation, radiography, observation and reassessment  After These Interventions, the Patient was reevaluated and was found with humerus fracture, treated with splint and sling.  Plan orthopedic follow-up either tomorrow or next week  CRITICAL CARE-no Performed by: Daleen Bo  Nursing Notes Reviewed/ Care Coordinated Applicable Imaging Reviewed Interpretation of Laboratory Data incorporated into ED treatment  The patient appears reasonably screened and/or stabilized for discharge and I doubt any other medical condition or other [facility] requiring further screening, evaluation, or treatment in the ED at this time prior to discharge.  Plan: Home Medications-continue usual; Home Treatments-sling, cryotherapy; return here if the recommended treatment, does not improve the symptoms; Recommended follow up-orthopedic follow-up ASAP        Medications Ordered in ED Medications - No data to display  ED Course  I have reviewed the triage vital signs and the nursing notes.  Pertinent labs & imaging results that were available during my care of the patient were reviewed by me and considered in  my medical decision making (see chart for details).  Clinical Course as of 03/13/21 2321  Thu Mar 13, 2021  2255 Case discussed with Dr. Doran Durand, orthopedics on-call who recommends coaptation splint, and follow-up with his office either tomorrow or next week. [EW]    Clinical Course User Index [EW] Daleen Bo, MD   MDM Rules/Calculators/A&P                                 Final Clinical Impression(s) / ED Diagnoses Final diagnoses:  Fall, initial encounter  Closed displaced oblique fracture of shaft of left humerus, initial encounter    Rx / DC Orders ED Discharge Orders     None        Daleen Bo, MD 03/17/21 [id number]

## 2021-03-13 NOTE — ED Notes (Signed)
Ortho contacted

## 2021-03-14 NOTE — ED Notes (Signed)
Ortho tech at bedside 

## 2021-03-14 NOTE — Progress Notes (Signed)
Orthopedic Tech Progress Note Patient Details:  DAIL LEREW 11/08/1945 [id number]  Ortho Devices Type of Ortho Device: Coapt, Shoulder immobilizer Ortho Device/Splint Location: LUE Ortho Device/Splint Interventions: Ordered, Application, Adjustment   Post Interventions Patient Tolerated: Well Instructions Provided: Adjustment of device, Care of device, Poper ambulation with device  Roshawna Colclasure 03/14/2021, 2:14 AM

## 2021-05-15 ENCOUNTER — Ambulatory Visit (HOSPITAL_COMMUNITY)
Admission: RE | Admit: 2021-05-15 | Discharge: 2021-05-15 | Disposition: A | Discharge status: Home / Self Care | Payer: PPO | Source: Ambulatory Visit | Attending: Orthopedic Surgery | Admitting: Orthopedic Surgery

## 2021-05-15 ENCOUNTER — Other Ambulatory Visit (HOSPITAL_COMMUNITY): Payer: Self-pay | Admitting: Physician Assistant

## 2021-05-15 ENCOUNTER — Other Ambulatory Visit: Payer: Self-pay

## 2021-05-15 DIAGNOSIS — M79602 Pain in left arm: Secondary | ICD-10-CM | POA: Diagnosis present

## 2022-07-20 NOTE — Progress Notes (Signed)
Sent message, via epic in basket, requesting orders in epic from surgeon.  

## 2022-07-23 ENCOUNTER — Ambulatory Visit: Payer: Self-pay | Admitting: Student

## 2022-07-23 DIAGNOSIS — E119 Type 2 diabetes mellitus without complications: Secondary | ICD-10-CM

## 2022-07-23 NOTE — Progress Notes (Signed)
Second request for pre op orders in [facility] left a voicemail for [employer] at [employer].

## 2022-07-23 NOTE — Patient Instructions (Addendum)
SURGICAL WAITING ROOM VISITATION  Patients having surgery or a procedure may have no more than 2 support people in the waiting area - these visitors may rotate.    Children under the age of 19 must have an adult with them who is not the patient.  If the patient needs to stay at the hospital during part of their recovery, the visitor guidelines for inpatient rooms apply.  Pre-op nurse will coordinate an appropriate time for 1 support person to accompany patient in pre-op.  This support person may not rotate.    Please refer to the [facility] website for the visitor guidelines for Inpatients (after your surgery is over and you are in a regular room).       Your procedure is scheduled on: 08-06-22   Report to [facility] Main Entrance    Report to admitting at 11:45 AM   Call this number if you have problems the morning of surgery [phone]   Do not eat food :After Midnight.   After Midnight you may have the following liquids until 11:15 AM DAY OF SURGERY  Water Non-Citrus Juices (without pulp, NO RED-Apple, White grape, White cranberry) Black Coffee (NO MILK/CREAM OR CREAMERS, sugar ok)  Clear Tea (NO MILK/CREAM OR CREAMERS, sugar ok) regular and decaf                             Plain Jell-O (NO RED)                                           Fruit ices (not with fruit pulp, NO RED)                                     Popsicles (NO RED)                                                               Sports drinks like Gatorade (NO RED)                  The day of surgery:  Drink ONE (1) Pre-Surgery G2 at 11:15 AM the morning of surgery. Drink in one sitting. Do not sip.  This drink was given to you during your hospital  pre-op appointment visit. Nothing else to drink after completing the Pre-Surgery G2.          If you have questions, please contact your surgeon's office.   FOLLOW  ANY ADDITIONAL PRE OP INSTRUCTIONS YOU RECEIVED FROM YOUR SURGEON'S OFFICE!!!      Oral Hygiene is also important to reduce your risk of infection.                                    Remember - BRUSH YOUR TEETH THE MORNING OF SURGERY WITH YOUR REGULAR TOOTHPASTE  DENTURES WILL BE REMOVED PRIOR TO SURGERY PLEASE DO NOT APPLY "Poly grip" OR ADHESIVES!!!   Do NOT smoke after Midnight   Take these medicines the  morning of surgery:   Amlodipine  Doxazosin  Levothyroxine  Primidone  Propranolol  Tamsulosin  Tylenol if needed    How to Manage Your Diabetes Before and After Surgery  Why is it important to control my blood sugar before and after surgery? Improving blood sugar levels before and after surgery helps healing and can limit problems. A way of improving blood sugar control is eating a healthy diet by:  Eating less sugar and carbohydrates  Increasing activity/exercise  Talking with your doctor about reaching your blood sugar goals High blood sugars (greater than 180 mg/dL) can raise your risk of infections and slow your recovery, so you will need to focus on controlling your diabetes during the weeks before surgery. Make sure that the doctor who takes care of your diabetes knows about your planned surgery including the date and location.  How do I manage my blood sugar before surgery? Check your blood sugar at least 4 times a day, starting 2 days before surgery, to make sure that the level is not too high or low. Check your blood sugar the morning of your surgery when you wake up and every 2 hours until you get to the Short Stay unit. If your blood sugar is less than 70 mg/dL, you will need to treat for low blood sugar: Do not take insulin. Treat a low blood sugar (less than 70 mg/dL) with  cup of clear juice (cranberry or apple), 4 glucose tablets, OR glucose gel. Recheck blood sugar in 15 minutes after treatment (to make sure it is greater than 70 mg/dL). If your blood sugar is not greater than 70 mg/dL on recheck, call [phone] for further  instructions. Report your blood sugar to the short stay nurse when you get to Short Stay.  If you are admitted to the hospital after surgery: Your blood sugar will be checked by the staff and you will probably be given insulin after surgery (instead of oral diabetes medicines) to make sure you have good blood sugar levels. The goal for blood sugar control after surgery is 80-180 mg/dL.   WHAT DO I DO ABOUT MY DIABETES MEDICATION?  Do not take oral diabetes medicines (pills) the morning of surgery.        Hold Jardiance 3 days before surgery (do not take after 08-02-22)        Do not take an evening dose of Glipizide the night before surgery  THE NIGHT BEFORE SURGERY:  Take 1/2 dose (18 units) of Insulin Glargine      DO NOT TAKE THE FOLLOWING 7 DAYS PRIOR TO SURGERY: Ozempic, Wegovy, Rybelsus (Semaglutide), Byetta (exenatide), Bydureon (exenatide ER), Victoza, Saxenda (liraglutide), or Trulicity (dulaglutide) Mounjaro (Tirzepatide) Adlyxin (Lixisenatide), Polyethylene Glycol Loxenatide.  Reviewed and Endorsed by [facility] Patient Education Committee, August 2015                              You may not have any metal on your body including jewelry, and body piercing             Do not wear  lotions, powders, cologne, or deodorant              Men may shave face and neck.   Do not bring valuables to the hospital. [facility] IS NOT RESPONSIBLE   FOR VALUABLES.   Contacts, glasses, dentures or bridgework may not be worn into surgery.   Bring small overnight bag day  of surgery.   DO NOT BRING YOUR HOME MEDICATIONS TO THE HOSPITAL. PHARMACY WILL DISPENSE MEDICATIONS LISTED ON YOUR MEDICATION LIST TO YOU DURING YOUR ADMISSION IN THE HOSPITAL!     Special Instructions: Bring a copy of your healthcare power of attorney and living will documents the day of surgery if you haven't scanned them before.              Please read over the following fact sheets you were given: IF YOU HAVE  QUESTIONS ABOUT YOUR PRE-OP INSTRUCTIONS PLEASE CALL [phone] [facility]   If you received a COVID test during your pre-op visit  it is requested that you wear a mask when out in public, stay away from anyone that may not be feeling well and notify your surgeon if you develop symptoms. If you test positive for Covid or have been in contact with anyone that has tested positive in the last 10 days please notify you surgeon.      Incentive Spirometer  An incentive spirometer is a tool that can help keep your lungs clear and active. This tool measures how well you are filling your lungs with each breath. Taking long deep breaths may help reverse or decrease the chance of developing breathing (pulmonary) problems (especially infection) following: A long period of time when you are unable to move or be active. BEFORE THE PROCEDURE  If the spirometer includes an indicator to show your best effort, your nurse or respiratory therapist will set it to a desired goal. If possible, sit up straight or lean slightly forward. Try not to slouch. Hold the incentive spirometer in an upright position. INSTRUCTIONS FOR USE  Sit on the edge of your bed if possible, or sit up as far as you can in bed or on a chair. Hold the incentive spirometer in an upright position. Breathe out normally. Place the mouthpiece in your mouth and seal your lips tightly around it. Breathe in slowly and as deeply as possible, raising the piston or the ball toward the top of the column. Hold your breath for 3-5 seconds or for as long as possible. Allow the piston or ball to fall to the bottom of the column. Remove the mouthpiece from your mouth and breathe out normally. Rest for a few seconds and repeat Steps 1 through 7 at least 10 times every 1-2 hours when you are awake. Take your time and take a few normal breaths between deep breaths. The spirometer may include an indicator to show your best effort. Use the indicator as a goal to  work toward during each repetition. After each set of 10 deep breaths, practice coughing to be sure your lungs are clear. If you have an incision (the cut made at the time of surgery), support your incision when coughing by placing a pillow or rolled up towels firmly against it. Once you are able to get out of bed, walk around indoors and cough well. You may stop using the incentive spirometer when instructed by your caregiver.  RISKS AND COMPLICATIONS Take your time so you do not get dizzy or light-headed. If you are in pain, you may need to take or ask for pain medication before doing incentive spirometry. It is harder to take a deep breath if you are having pain. AFTER USE Rest and breathe slowly and easily. It can be helpful to keep track of a log of your progress. Your caregiver can provide you with a simple table to help with this. If you  are using the spirometer at home, follow these instructions: SEEK MEDICAL CARE IF:  You are having difficultly using the spirometer. You have trouble using the spirometer as often as instructed. Your pain medication is not giving enough relief while using the spirometer. You develop fever of 100.5 F (38.1 C) or higher. SEEK IMMEDIATE MEDICAL CARE IF:  You cough up bloody sputum that had not been present before. You develop fever of 102 F (38.9 C) or greater. You develop worsening pain at or near the incision site. MAKE SURE YOU:  Understand these instructions. Will watch your condition. Will get help right away if you are not doing well or get worse. Document Released: 07/20/2006 Document Revised: 06/01/2011 Document Reviewed: 09/20/2006 [facility] Patient Information 2014 [place], [place].   ________________________________________________________________________

## 2022-07-24 NOTE — Progress Notes (Signed)
COVID Vaccine Completed:  Date of COVID positive in last 90 days:  PCP - Andee Lineman, DO Cardiologist -   Chest x-ray -  EKG -  Stress Test -  ECHO -  Cardiac Cath -  Pacemaker/ICD device last checked: Spinal Cord Stimulator:  Bowel Prep -   Sleep Study -  CPAP -   Fasting Blood Sugar -  Checks Blood Sugar _____ times a day  Last dose of GLP1 agonist-  N/A GLP1 instructions:  N/A   Jardiance  Last dose of SGLT-2 inhibitors-  N/A SGLT-2 instructions: Hold 3 days before surgery (do not take after 08-02-22)   Blood Thinner Instructions:  Time Aspirin Instructions: Last Dose:  Activity level:  Can go up a flight of stairs and perform activities of daily living without stopping and without symptoms of chest pain or shortness of breath.  Able to exercise without symptoms  Unable to go up a flight of stairs without symptoms of     Anesthesia review:   Patient denies shortness of breath, fever, cough and chest pain at PAT appointment  Patient verbalized understanding of instructions that were given to them at the PAT appointment. Patient was also instructed that they will need to review over the PAT instructions again at home before surgery.

## 2022-07-27 ENCOUNTER — Encounter (HOSPITAL_COMMUNITY)
Admission: RE | Admit: 2022-07-27 | Discharge: 2022-07-27 | Disposition: A | Discharge status: Home / Self Care | Payer: No Typology Code available for payment source | Source: Ambulatory Visit | Attending: Orthopedic Surgery | Admitting: Orthopedic Surgery

## 2022-07-27 ENCOUNTER — Other Ambulatory Visit: Payer: Self-pay

## 2022-07-27 ENCOUNTER — Encounter (HOSPITAL_COMMUNITY): Payer: Self-pay

## 2022-07-27 VITALS — BP 148/57 | HR 53 | Temp 97.9°F | Resp 20 | Ht 67.75 in | Wt 226.6 lb

## 2022-07-27 DIAGNOSIS — Z794 Long term (current) use of insulin: Secondary | ICD-10-CM | POA: Insufficient documentation

## 2022-07-27 DIAGNOSIS — R001 Bradycardia, unspecified: Secondary | ICD-10-CM | POA: Insufficient documentation

## 2022-07-27 DIAGNOSIS — Z01818 Encounter for other preprocedural examination: Secondary | ICD-10-CM | POA: Diagnosis present

## 2022-07-27 DIAGNOSIS — E119 Type 2 diabetes mellitus without complications: Secondary | ICD-10-CM | POA: Diagnosis not present

## 2022-07-27 HISTORY — DX: Pneumonia, unspecified organism: J18.9

## 2022-07-27 LAB — BASIC METABOLIC PANEL
Anion gap: 7 (ref 5–15)
BUN: 24 mg/dL — ABNORMAL HIGH (ref 8–23)
CO2: 25 mmol/L (ref 22–32)
Calcium: 8.8 mg/dL — ABNORMAL LOW (ref 8.9–10.3)
Chloride: 105 mmol/L (ref 98–111)
Creatinine, Ser: 1.43 mg/dL — ABNORMAL HIGH (ref 0.61–1.24)
GFR, Estimated: 50 mL/min — ABNORMAL LOW (ref >60)
Glucose, Bld: 93 mg/dL (ref 70–99)
Potassium: 4.6 mmol/L (ref 3.5–5.1)
Sodium: 137 mmol/L (ref 135–145)

## 2022-07-27 LAB — SURGICAL PCR SCREEN
MRSA, PCR: NEGATIVE
Staphylococcus aureus: NEGATIVE

## 2022-07-27 LAB — CBC
HCT: 41.4 % (ref 39.0–52.0)
Hemoglobin: 13.4 g/dL (ref 13.0–17.0)
MCH: 31.4 pg (ref 26.0–34.0)
MCHC: 32.4 g/dL (ref 30.0–36.0)
MCV: 97 fL (ref 80.0–100.0)
Platelets: 196 10*3/uL (ref 150–400)
RBC: 4.27 MIL/uL (ref 4.22–5.81)
RDW: 13.3 % (ref 11.5–15.5)
WBC: 10.1 10*3/uL (ref 4.0–10.5)
nRBC: 0 % (ref 0.0–0.2)

## 2022-07-27 LAB — HEMOGLOBIN A1C
Hgb A1c MFr Bld: 6.8 % — ABNORMAL HIGH (ref 4.8–5.6)
Mean Plasma Glucose: 148.46 mg/dL

## 2022-07-27 LAB — GLUCOSE, CAPILLARY: Glucose-Capillary: 83 mg/dL (ref 70–99)

## 2022-07-28 ENCOUNTER — Ambulatory Visit: Payer: Self-pay | Admitting: Student

## 2022-08-05 ENCOUNTER — Ambulatory Visit: Payer: Self-pay | Admitting: Student

## 2022-08-05 NOTE — H&P (Signed)
TOTAL KNEE ADMISSION H&P  Patient is being admitted for right total knee arthroplasty.  Subjective:  Chief Complaint:right knee pain.  HPI: [name], 77 y.o. male, has a history of pain and functional disability in the right knee due to arthritis and has failed non-surgical conservative treatments for greater than 12 weeks to includeNSAID's and/or analgesics, corticosteriod injections, flexibility and strengthening excercises, use of assistive devices, and activity modification.  Onset of symptoms was gradual, starting 10 years ago with rapidlly worsening course since that time. The patient noted no past surgery on the right knee(s).  Patient currently rates pain in the right knee(s) at 10 out of 10 with activity. Patient has night pain, worsening of pain with activity and weight bearing, pain that interferes with activities of daily living, pain with passive range of motion, crepitus, and joint swelling.  Patient has evidence of subchondral cysts, subchondral sclerosis, periarticular osteophytes, and joint space narrowing by imaging studies. There is no active infection.  Patient Active Problem List   Diagnosis Date Noted   Anemia of chronic disease 06/30/2012   Personal history of noncompliance with medical treatment, presenting hazards to health 04/28/2011   Hyperlipidemia LDL goal <70 09/05/2010   Hypertension associated with diabetes (HCC) 09/05/2010   Obesity (BMI 30-39.9) 09/05/2010   Past Medical History:  Diagnosis Date   Benign localized prostatic hyperplasia with lower urinary tract symptoms (LUTS)    CKD (chronic kidney disease), stage III (HCC)    followed by pcp   ED (erectile dysfunction)    Essential tremor    History of chronic prostatitis    History of colonic polyps    History of urinary retention    Hyperlipidemia    Hypertension    followed by pcp   Hypothyroidism    followed by pcp   OA (osteoarthritis)    knees   Phimosis    Pneumonia    2014    Type 2 diabetes mellitus treated with insulin (HCC)    followed by pcp and endocrinologist--- dr w. Katrinka Blazing   (12-05-2020  per pt checks blood sugar daily in am,  fasting surgery-- 85--120)   Wears hearing aid in both ears     Past Surgical History:  Procedure Laterality Date   CATARACT EXTRACTION W/ INTRAOCULAR LENS IMPLANT  2012   CIRCUMCISION N/A 12/09/2020   Procedure: CIRCUMCISION ADULT WITH FRENULOPLASTY/ DORSAL NERVE BLOCK;  Surgeon: Jannifer Hick, MD;  Location: [facility];  Service: Urology;  Laterality: N/A;   COLONOSCOPY     lasr one approx 2017   HERNIA REPAIR  1996   LUMBAR LAMINECTOMY  07/27/2007   @[facility]   by dr [name];   L3-4  and L4-5   TONSILLECTOMY     age 45   TOTAL HIP ARTHROPLASTY Right 02/08/2007   @[facility]  by dr Charlann Boxer    Current Outpatient Medications  Medication Sig Dispense Refill Last Dose   acetaminophen (TYLENOL) 500 MG tablet Take 1,000 mg by mouth 2 (two) times daily.      Alogliptin Benzoate 25 MG TABS Take 25 mg by mouth daily.      amLODipine (NORVASC) 2.5 MG tablet Take 2.5 mg by mouth daily.      chlorpheniramine (CHLOR-TRIMETON) 4 MG tablet Take 4 mg by mouth 2 (two) times daily as needed for allergies.      cholecalciferol (VITAMIN D3) 25 MCG (1000 UNIT) tablet Take 1,000 Units by mouth daily.      doxazosin (CARDURA) 4 MG tablet  Take 2 mg by mouth in the morning.      empagliflozin (JARDIANCE) 25 MG TABS tablet Take 25 mg by mouth daily.      glipiZIDE (GLUCOTROL) 5 MG tablet Take 5 mg by mouth daily before breakfast.      Glucosamine-Chondroit-Vit C-Mn (GLUCOSAMINE CHONDROITIN COMPLX) CAPS Take 1 capsule by mouth 2 (two) times daily.      insulin glargine (LANTUS) 100 UNIT/ML injection Inject 36 Units into the skin at bedtime.      ipratropium (ATROVENT) 0.03 % nasal spray Place 2 sprays into both nostrils daily as needed for rhinitis.      levothyroxine (SYNTHROID) 137 MCG tablet Take 137 mcg by mouth daily before breakfast.      losartan  (COZAAR) 100 MG tablet Take 100 mg by mouth at bedtime.      metFORMIN (GLUMETZA) 500 MG (MOD) 24 hr tablet Take 500 mg by mouth 2 (two) times daily with a meal.      Multiple Vitamins-Minerals (MULTIVITAMIN WITH MINERALS) tablet Take 1 tablet by mouth daily.      primidone (MYSOLINE) 50 MG tablet Take 50 mg by mouth 2 (two) times daily.      propranolol (INDERAL) 20 MG tablet Take 20 mg by mouth daily.      simvastatin (ZOCOR) 80 MG tablet Take 80 mg by mouth at bedtime.      tamsulosin (FLOMAX) 0.4 MG CAPS capsule Take 0.4 mg by mouth daily.      No current facility-administered medications for this visit.   Facility-Administered Medications Ordered in Other Visits  Medication Dose Route Frequency Provider Last Rate Last Admin   influenza  inactive virus vaccine (FLUZONE/FLUARIX) injection 0.5 mL  0.5 mL Intramuscular Once Ronnald Nian, MD       Allergies  Allergen Reactions   Sulfa Antibiotics Hives, Shortness Of Breath and Rash    Other reaction(s): rash   Morphine Nausea And Vomiting   Morphine And Codeine Nausea And Vomiting   Morphine Sulfate     Other reaction(s): vomiting    Social History   Tobacco Use   Smoking status: Former    Packs/day: 2.00    Years: 35.00    Additional pack years: 0.00    Total pack years: 70.00    Types: Cigarettes    Quit date: 1995    Years since quitting: 29.3   Smokeless tobacco: Never  Substance Use Topics   Alcohol use: No    No family history on file.   Review of Systems  Musculoskeletal:  Positive for arthralgias, gait problem and joint swelling.  All other systems reviewed and are negative.   Objective:  Physical Exam Constitutional:      Appearance: Normal appearance.  HENT:     Head: Normocephalic and atraumatic.     Nose: Nose normal.     Mouth/Throat:     Mouth: Mucous membranes are moist.     Pharynx: Oropharynx is clear.  Eyes:     Conjunctiva/sclera: Conjunctivae normal.  Cardiovascular:     Rate and  Rhythm: Normal rate and regular rhythm.     Pulses: Normal pulses.     Heart sounds: Normal heart sounds.  Pulmonary:     Effort: Pulmonary effort is normal.     Breath sounds: Normal breath sounds.  Abdominal:     General: Abdomen is flat.     Palpations: Abdomen is soft.  Genitourinary:    Comments: deferred Musculoskeletal:     Cervical back:  Normal range of motion and neck supple.     Comments: Examination of the right knee reveals no skin wounds or lesions. He has swelling, trace effusion. No warmth or erythema. Varus deformity. Tenderness to palpation medial joint line, lateral joint line, peripatellar retinacular tissues with a positive grind sign. Range of motion is 15 to 90 degrees without any ligamentous instability. Painless range of motion of the hip.  Distally, there is no focal motor or sensory deficit. He has 1+ palpable DP pulses.  He has mild symmetric pedal edema. No calf tenderness to palpation.  He has healed superficial abrasions, right worse than left lower extremity, which she states is due to 2 cats.  He ambulates with an antalgic gait using a rollator.  Skin:    General: Skin is warm and dry.     Capillary Refill: Capillary refill takes less than 2 seconds.  Neurological:     General: No focal deficit present.     Mental Status: He is alert and oriented to person, place, and time.  Psychiatric:        Mood and Affect: Mood normal.        Behavior: Behavior normal.        Thought Content: Thought content normal.        Judgment: Judgment normal.     Vital signs in last 24 hours: @VSRANGES @  Labs:   Estimated body mass index is 34.71 kg/m as calculated from the following:   Height as of 07/27/22: 5' 7.75" (1.721 m).   Weight as of 07/27/22: 102.8 kg.   Imaging Review Plain radiographs demonstrate severe degenerative joint disease of the right knee(s). The overall alignment issignificant varus. The bone quality appears to be adequate for age and  reported activity level.      Assessment/Plan:  End stage arthritis, right knee   The patient history, physical examination, clinical judgment of the provider and imaging studies are consistent with end stage degenerative joint disease of the right knee(s) and total knee arthroplasty is deemed medically necessary. The treatment options including medical management, injection therapy arthroscopy and arthroplasty were discussed at length. The risks and benefits of total knee arthroplasty were presented and reviewed. The risks due to aseptic loosening, infection, stiffness, patella tracking problems, thromboembolic complications and other imponderables were discussed. The patient acknowledged the explanation, agreed to proceed with the plan and consent was signed. Patient is being admitted for inpatient treatment for surgery, pain control, PT, OT, prophylactic antibiotics, VTE prophylaxis, progressive ambulation and ADL's and discharge planning. The patient is planning to be discharged with an overnight stay.    Therapy Plans: outpatient therapy. 1st PT appointment 08/10/22 at [facility].  Disposition: Home with wife Planned DVT Prophylaxis: aspirin 81mg  BID DME needed: Has rolling [place]. Discussed to not use rollator after surgery. Rx sent to [facility] and printed copy given to patient for ice machine.  PCP: Cleared.  TXA: IV Allergies:  - Morphine - N/V - Sulfa antibiotics - hives, SOB.  Anesthesia Concerns: None.  BMI: 35.5 Last HgbA1c: 6.8 Other: - T2DM, insulin.  - Oxycodone, zofran.  - NO NSAIDs.  - Ice machine? - 07/27/22: Hgb 13.4, K+ 4.6, Cr. 1.43.     Patient's anticipated LOS is less than 2 midnights, meeting these requirements: - Younger than 17 - Lives within 1 hour of care - Has a competent adult at home to recover with post-op recover - NO history of  - Chronic pain requiring opiods  -  Diabetes  - Coronary Artery Disease  - Heart failure  - Heart  attack  - Stroke  - DVT/VTE  - Cardiac arrhythmia  - Respiratory Failure/COPD  - Renal failure  - Anemia  - Advanced Liver disease

## 2022-08-05 NOTE — H&P (View-Only) (Signed)
TOTAL KNEE ADMISSION H&P  Patient is being admitted for right total knee arthroplasty.  Subjective:  Chief Complaint:right knee pain.  HPI: [name], 77 y.o. male, has a history of pain and functional disability in the right knee due to arthritis and has failed non-surgical conservative treatments for greater than 12 weeks to includeNSAID's and/or analgesics, corticosteriod injections, flexibility and strengthening excercises, use of assistive devices, and activity modification.  Onset of symptoms was gradual, starting 10 years ago with rapidlly worsening course since that time. The patient noted no past surgery on the right knee(s).  Patient currently rates pain in the right knee(s) at 10 out of 10 with activity. Patient has night pain, worsening of pain with activity and weight bearing, pain that interferes with activities of daily living, pain with passive range of motion, crepitus, and joint swelling.  Patient has evidence of subchondral cysts, subchondral sclerosis, periarticular osteophytes, and joint space narrowing by imaging studies. There is no active infection.  Patient Active Problem List   Diagnosis Date Noted   Anemia of chronic disease 06/30/2012   Personal history of noncompliance with medical treatment, presenting hazards to health 04/28/2011   Hyperlipidemia LDL goal <70 09/05/2010   Hypertension associated with diabetes (HCC) 09/05/2010   Obesity (BMI 30-39.9) 09/05/2010   Past Medical History:  Diagnosis Date   Benign localized prostatic hyperplasia with lower urinary tract symptoms (LUTS)    CKD (chronic kidney disease), stage III (HCC)    followed by pcp   ED (erectile dysfunction)    Essential tremor    History of chronic prostatitis    History of colonic polyps    History of urinary retention    Hyperlipidemia    Hypertension    followed by pcp   Hypothyroidism    followed by pcp   OA (osteoarthritis)    knees   Phimosis    Pneumonia    2014    Type 2 diabetes mellitus treated with insulin (HCC)    followed by pcp and endocrinologist--- dr w. smith   (12-05-2020  per pt checks blood sugar daily in am,  fasting surgery-- 85--120)   Wears hearing aid in both ears     Past Surgical History:  Procedure Laterality Date   CATARACT EXTRACTION W/ INTRAOCULAR LENS IMPLANT  2012   CIRCUMCISION N/A 12/09/2020   Procedure: CIRCUMCISION ADULT WITH FRENULOPLASTY/ DORSAL NERVE BLOCK;  Surgeon: Gay, Matthew R, MD;  Location: [facility];  Service: Urology;  Laterality: N/A;   COLONOSCOPY     lasr one approx 2017   HERNIA REPAIR  1996   LUMBAR LAMINECTOMY  07/27/2007   @[facility]  by dr gioffre;   L3-4  and L4-5   TONSILLECTOMY     age 3   TOTAL HIP ARTHROPLASTY Right 02/08/2007   @[facility] by dr olin    Current Outpatient Medications  Medication Sig Dispense Refill Last Dose   acetaminophen (TYLENOL) 500 MG tablet Take 1,000 mg by mouth 2 (two) times daily.      Alogliptin Benzoate 25 MG TABS Take 25 mg by mouth daily.      amLODipine (NORVASC) 2.5 MG tablet Take 2.5 mg by mouth daily.      chlorpheniramine (CHLOR-TRIMETON) 4 MG tablet Take 4 mg by mouth 2 (two) times daily as needed for allergies.      cholecalciferol (VITAMIN D3) 25 MCG (1000 UNIT) tablet Take 1,000 Units by mouth daily.      doxazosin (CARDURA) 4 MG tablet   Take 2 mg by mouth in the morning.      empagliflozin (JARDIANCE) 25 MG TABS tablet Take 25 mg by mouth daily.      glipiZIDE (GLUCOTROL) 5 MG tablet Take 5 mg by mouth daily before breakfast.      Glucosamine-Chondroit-Vit C-Mn (GLUCOSAMINE CHONDROITIN COMPLX) CAPS Take 1 capsule by mouth 2 (two) times daily.      insulin glargine (LANTUS) 100 UNIT/ML injection Inject 36 Units into the skin at bedtime.      ipratropium (ATROVENT) 0.03 % nasal spray Place 2 sprays into both nostrils daily as needed for rhinitis.      levothyroxine (SYNTHROID) 137 MCG tablet Take 137 mcg by mouth daily before breakfast.      losartan  (COZAAR) 100 MG tablet Take 100 mg by mouth at bedtime.      metFORMIN (GLUMETZA) 500 MG (MOD) 24 hr tablet Take 500 mg by mouth 2 (two) times daily with a meal.      Multiple Vitamins-Minerals (MULTIVITAMIN WITH MINERALS) tablet Take 1 tablet by mouth daily.      primidone (MYSOLINE) 50 MG tablet Take 50 mg by mouth 2 (two) times daily.      propranolol (INDERAL) 20 MG tablet Take 20 mg by mouth daily.      simvastatin (ZOCOR) 80 MG tablet Take 80 mg by mouth at bedtime.      tamsulosin (FLOMAX) 0.4 MG CAPS capsule Take 0.4 mg by mouth daily.      No current facility-administered medications for this visit.   Facility-Administered Medications Ordered in Other Visits  Medication Dose Route Frequency Provider Last Rate Last Admin   influenza  inactive virus vaccine (FLUZONE/FLUARIX) injection 0.5 mL  0.5 mL Intramuscular Once Lalonde, John C, MD       Allergies  Allergen Reactions   Sulfa Antibiotics Hives, Shortness Of Breath and Rash    Other reaction(s): rash   Morphine Nausea And Vomiting   Morphine And Codeine Nausea And Vomiting   Morphine Sulfate     Other reaction(s): vomiting    Social History   Tobacco Use   Smoking status: Former    Packs/day: 2.00    Years: 35.00    Additional pack years: 0.00    Total pack years: 70.00    Types: Cigarettes    Quit date: 1995    Years since quitting: 29.3   Smokeless tobacco: Never  Substance Use Topics   Alcohol use: No    No family history on file.   Review of Systems  Musculoskeletal:  Positive for arthralgias, gait problem and joint swelling.  All other systems reviewed and are negative.   Objective:  Physical Exam Constitutional:      Appearance: Normal appearance.  HENT:     Head: Normocephalic and atraumatic.     Nose: Nose normal.     Mouth/Throat:     Mouth: Mucous membranes are moist.     Pharynx: Oropharynx is clear.  Eyes:     Conjunctiva/sclera: Conjunctivae normal.  Cardiovascular:     Rate and  Rhythm: Normal rate and regular rhythm.     Pulses: Normal pulses.     Heart sounds: Normal heart sounds.  Pulmonary:     Effort: Pulmonary effort is normal.     Breath sounds: Normal breath sounds.  Abdominal:     General: Abdomen is flat.     Palpations: Abdomen is soft.  Genitourinary:    Comments: deferred Musculoskeletal:     Cervical back:   Normal range of motion and neck supple.     Comments: Examination of the right knee reveals no skin wounds or lesions. He has swelling, trace effusion. No warmth or erythema. Varus deformity. Tenderness to palpation medial joint line, lateral joint line, peripatellar retinacular tissues with a positive grind sign. Range of motion is 15 to 90 degrees without any ligamentous instability. Painless range of motion of the hip.  Distally, there is no focal motor or sensory deficit. He has 1+ palpable DP pulses.  He has mild symmetric pedal edema. No calf tenderness to palpation.  He has healed superficial abrasions, right worse than left lower extremity, which she states is due to 2 cats.  He ambulates with an antalgic gait using a rollator.  Skin:    General: Skin is warm and dry.     Capillary Refill: Capillary refill takes less than 2 seconds.  Neurological:     General: No focal deficit present.     Mental Status: He is alert and oriented to person, place, and time.  Psychiatric:        Mood and Affect: Mood normal.        Behavior: Behavior normal.        Thought Content: Thought content normal.        Judgment: Judgment normal.     Vital signs in last 24 hours: @VSRANGES@  Labs:   Estimated body mass index is 34.71 kg/m as calculated from the following:   Height as of 07/27/22: 5' 7.75" (1.721 m).   Weight as of 07/27/22: 102.8 kg.   Imaging Review Plain radiographs demonstrate severe degenerative joint disease of the right knee(s). The overall alignment issignificant varus. The bone quality appears to be adequate for age and  reported activity level.      Assessment/Plan:  End stage arthritis, right knee   The patient history, physical examination, clinical judgment of the provider and imaging studies are consistent with end stage degenerative joint disease of the right knee(s) and total knee arthroplasty is deemed medically necessary. The treatment options including medical management, injection therapy arthroscopy and arthroplasty were discussed at length. The risks and benefits of total knee arthroplasty were presented and reviewed. The risks due to aseptic loosening, infection, stiffness, patella tracking problems, thromboembolic complications and other imponderables were discussed. The patient acknowledged the explanation, agreed to proceed with the plan and consent was signed. Patient is being admitted for inpatient treatment for surgery, pain control, PT, OT, prophylactic antibiotics, VTE prophylaxis, progressive ambulation and ADL's and discharge planning. The patient is planning to be discharged with an overnight stay.    Therapy Plans: outpatient therapy. 1st PT appointment 08/10/22 at [facility].  Disposition: Home with wife Planned DVT Prophylaxis: aspirin 81mg BID DME needed: Has rolling [place]. Discussed to not use rollator after surgery. Rx sent to [facility] and printed copy given to patient for ice machine.  PCP: Cleared.  TXA: IV Allergies:  - Morphine - N/V - Sulfa antibiotics - hives, SOB.  Anesthesia Concerns: None.  BMI: 35.5 Last HgbA1c: 6.8 Other: - T2DM, insulin.  - Oxycodone, zofran.  - NO NSAIDs.  - Ice machine? - 07/27/22: Hgb 13.4, K+ 4.6, Cr. 1.43.     Patient's anticipated LOS is less than 2 midnights, meeting these requirements: - Younger than 65 - Lives within 1 hour of care - Has a competent adult at home to recover with post-op recover - NO history of  - Chronic pain requiring opiods  -   Diabetes  - Coronary Artery Disease  - Heart failure  - Heart  attack  - Stroke  - DVT/VTE  - Cardiac arrhythmia  - Respiratory Failure/COPD  - Renal failure  - Anemia  - Advanced Liver disease   

## 2022-08-06 ENCOUNTER — Encounter (HOSPITAL_COMMUNITY)
Admission: RE | Disposition: A | Discharge status: Home / Self Care | Payer: Self-pay | Source: Ambulatory Visit | Attending: Orthopedic Surgery

## 2022-08-06 ENCOUNTER — Other Ambulatory Visit: Payer: Self-pay

## 2022-08-06 ENCOUNTER — Ambulatory Visit (HOSPITAL_COMMUNITY): Payer: No Typology Code available for payment source | Admitting: Anesthesiology

## 2022-08-06 ENCOUNTER — Observation Stay (HOSPITAL_COMMUNITY): Payer: No Typology Code available for payment source

## 2022-08-06 ENCOUNTER — Encounter (HOSPITAL_COMMUNITY): Payer: Self-pay | Admitting: Orthopedic Surgery

## 2022-08-06 ENCOUNTER — Observation Stay (HOSPITAL_COMMUNITY)
Admission: RE | Admit: 2022-08-06 | Discharge: 2022-08-07 | Disposition: A | Discharge status: Home / Self Care | Payer: No Typology Code available for payment source | Source: Ambulatory Visit | Attending: Orthopedic Surgery | Admitting: Orthopedic Surgery

## 2022-08-06 DIAGNOSIS — I129 Hypertensive chronic kidney disease with stage 1 through stage 4 chronic kidney disease, or unspecified chronic kidney disease: Secondary | ICD-10-CM | POA: Diagnosis not present

## 2022-08-06 DIAGNOSIS — E1122 Type 2 diabetes mellitus with diabetic chronic kidney disease: Secondary | ICD-10-CM | POA: Diagnosis not present

## 2022-08-06 DIAGNOSIS — I10 Essential (primary) hypertension: Secondary | ICD-10-CM | POA: Diagnosis not present

## 2022-08-06 DIAGNOSIS — N189 Chronic kidney disease, unspecified: Secondary | ICD-10-CM | POA: Insufficient documentation

## 2022-08-06 DIAGNOSIS — Z87891 Personal history of nicotine dependence: Secondary | ICD-10-CM

## 2022-08-06 DIAGNOSIS — Z96651 Presence of right artificial knee joint: Secondary | ICD-10-CM

## 2022-08-06 DIAGNOSIS — E039 Hypothyroidism, unspecified: Secondary | ICD-10-CM | POA: Insufficient documentation

## 2022-08-06 DIAGNOSIS — Z7984 Long term (current) use of oral hypoglycemic drugs: Secondary | ICD-10-CM | POA: Diagnosis not present

## 2022-08-06 DIAGNOSIS — M1711 Unilateral primary osteoarthritis, right knee: Secondary | ICD-10-CM | POA: Diagnosis not present

## 2022-08-06 DIAGNOSIS — Z794 Long term (current) use of insulin: Secondary | ICD-10-CM | POA: Insufficient documentation

## 2022-08-06 DIAGNOSIS — Z79899 Other long term (current) drug therapy: Secondary | ICD-10-CM | POA: Insufficient documentation

## 2022-08-06 DIAGNOSIS — E119 Type 2 diabetes mellitus without complications: Secondary | ICD-10-CM

## 2022-08-06 DIAGNOSIS — Z96641 Presence of right artificial hip joint: Secondary | ICD-10-CM | POA: Diagnosis not present

## 2022-08-06 DIAGNOSIS — D638 Anemia in other chronic diseases classified elsewhere: Secondary | ICD-10-CM

## 2022-08-06 HISTORY — PX: KNEE ARTHROPLASTY: SHX992

## 2022-08-06 LAB — GLUCOSE, CAPILLARY
Glucose-Capillary: 97 mg/dL (ref 70–99)
Glucose-Capillary: 89 mg/dL (ref 70–99)
Glucose-Capillary: 116 mg/dL — ABNORMAL HIGH (ref 70–99)
Glucose-Capillary: 206 mg/dL — ABNORMAL HIGH (ref 70–99)

## 2022-08-06 SURGERY — ARTHROPLASTY, KNEE, TOTAL, USING IMAGELESS COMPUTER-ASSISTED NAVIGATION
Anesthesia: Monitor Anesthesia Care | Site: Knee | Laterality: Right

## 2022-08-06 MED ORDER — OXYCODONE HCL 5 MG PO TABS: 10.0000 mg | ORAL_TABLET | ORAL | Status: DC | PRN

## 2022-08-06 MED ORDER — EPINEPHRINE PF 1 MG/ML IJ SOLN
INTRAMUSCULAR | Status: DC | PRN
  Administered 2022-08-06: .15 mL

## 2022-08-06 MED ORDER — ONDANSETRON HCL 4 MG/2ML IJ SOLN: 4.0000 mg | Freq: Once | INTRAMUSCULAR | Status: DC | PRN

## 2022-08-06 MED ORDER — METHOCARBAMOL 500 MG PO TABS
500.0000 mg | ORAL_TABLET | Freq: Four times a day (QID) | ORAL | Status: DC | PRN
  Administered 2022-08-07: 500 mg via ORAL
  Filled 2022-08-06: qty 1

## 2022-08-06 MED ORDER — ALOGLIPTIN BENZOATE 25 MG PO TABS: 25.0000 mg | ORAL_TABLET | Freq: Every day | ORAL | Status: DC

## 2022-08-06 MED ORDER — DOXAZOSIN MESYLATE 4 MG PO TABS
2.0000 mg | ORAL_TABLET | Freq: Every morning | ORAL | Status: DC
  Administered 2022-08-07: 2 mg via ORAL
  Filled 2022-08-06: qty 1

## 2022-08-06 MED ORDER — MEPERIDINE HCL 50 MG/ML IJ SOLN: 6.2500 mg | INTRAMUSCULAR | Status: DC | PRN

## 2022-08-06 MED ORDER — PROPRANOLOL HCL 20 MG PO TABS
20.0000 mg | ORAL_TABLET | Freq: Every day | ORAL | Status: DC
  Administered 2022-08-07: 20 mg via ORAL
  Filled 2022-08-06: qty 1

## 2022-08-06 MED ORDER — ONDANSETRON HCL 4 MG/2ML IJ SOLN: 4.0000 mg | Freq: Four times a day (QID) | INTRAMUSCULAR | Status: DC | PRN

## 2022-08-06 MED ORDER — ROPIVACAINE HCL 5 MG/ML IJ SOLN
INTRAMUSCULAR | Status: DC | PRN
  Administered 2022-08-06: 20 mL via PERINEURAL

## 2022-08-06 MED ORDER — SODIUM CHLORIDE (PF) 0.9 % IJ SOLN
INTRAMUSCULAR | Status: AC
  Filled 2022-08-06: qty 50

## 2022-08-06 MED ORDER — LACTATED RINGERS IV SOLN: INTRAVENOUS | Status: DC

## 2022-08-06 MED ORDER — LEVOTHYROXINE SODIUM 25 MCG PO TABS
137.0000 ug | ORAL_TABLET | Freq: Every day | ORAL | Status: DC
  Administered 2022-08-07: 137 ug via ORAL
  Filled 2022-08-06: qty 1

## 2022-08-06 MED ORDER — KETOROLAC TROMETHAMINE 30 MG/ML IJ SOLN
INTRAMUSCULAR | Status: DC | PRN
  Administered 2022-08-06: 30 mg

## 2022-08-06 MED ORDER — ASPIRIN 81 MG PO CHEW
81.0000 mg | CHEWABLE_TABLET | Freq: Two times a day (BID) | ORAL | Status: DC
  Administered 2022-08-06 – 2022-08-07 (×2): 81 mg via ORAL
  Filled 2022-08-06 (×2): qty 1

## 2022-08-06 MED ORDER — FENTANYL CITRATE PF 50 MCG/ML IJ SOSY: 25.0000 ug | PREFILLED_SYRINGE | INTRAMUSCULAR | Status: DC | PRN

## 2022-08-06 MED ORDER — KETOROLAC TROMETHAMINE 30 MG/ML IJ SOLN
INTRAMUSCULAR | Status: AC
  Filled 2022-08-06: qty 1

## 2022-08-06 MED ORDER — CEFAZOLIN SODIUM-DEXTROSE 2-4 GM/100ML-% IV SOLN
2.0000 g | INTRAVENOUS | Status: AC
  Administered 2022-08-06: 2 g via INTRAVENOUS
  Filled 2022-08-06: qty 100

## 2022-08-06 MED ORDER — PHENYLEPHRINE HCL-NACL 20-0.9 MG/250ML-% IV SOLN
INTRAVENOUS | Status: DC | PRN
  Administered 2022-08-06: 25 ug/min via INTRAVENOUS

## 2022-08-06 MED ORDER — EPINEPHRINE PF 1 MG/ML IJ SOLN
INTRAMUSCULAR | Status: AC
  Filled 2022-08-06: qty 1

## 2022-08-06 MED ORDER — TRANEXAMIC ACID-NACL 1000-0.7 MG/100ML-% IV SOLN
1000.0000 mg | INTRAVENOUS | Status: AC
  Administered 2022-08-06: 1000 mg via INTRAVENOUS
  Filled 2022-08-06: qty 100

## 2022-08-06 MED ORDER — INSULIN ASPART 100 UNIT/ML IJ SOLN
0.0000 [IU] | Freq: Every day | INTRAMUSCULAR | Status: DC
  Administered 2022-08-06: 2 [IU] via SUBCUTANEOUS

## 2022-08-06 MED ORDER — OXYCODONE HCL 5 MG PO TABS
5.0000 mg | ORAL_TABLET | ORAL | Status: DC | PRN
  Administered 2022-08-06: 10 mg via ORAL
  Administered 2022-08-06: 5 mg via ORAL
  Administered 2022-08-07 (×2): 10 mg via ORAL
  Filled 2022-08-06 (×2): qty 2
  Filled 2022-08-06: qty 1
  Filled 2022-08-06: qty 2

## 2022-08-06 MED ORDER — PROPOFOL 500 MG/50ML IV EMUL
INTRAVENOUS | Status: DC | PRN
  Administered 2022-08-06: 30 mg via INTRAVENOUS
  Administered 2022-08-06: 75 ug/kg/min via INTRAVENOUS

## 2022-08-06 MED ORDER — SODIUM CHLORIDE 0.9 % IV SOLN: INTRAVENOUS | Status: DC

## 2022-08-06 MED ORDER — ACETAMINOPHEN 325 MG PO TABS: 325.0000 mg | ORAL_TABLET | ORAL | Status: DC | PRN

## 2022-08-06 MED ORDER — LINAGLIPTIN 5 MG PO TABS
5.0000 mg | ORAL_TABLET | Freq: Every day | ORAL | Status: DC
  Administered 2022-08-07: 5 mg via ORAL
  Filled 2022-08-06: qty 1

## 2022-08-06 MED ORDER — MIDAZOLAM HCL 2 MG/2ML IJ SOLN
1.0000 mg | INTRAMUSCULAR | Status: DC
  Administered 2022-08-06: 1 mg via INTRAVENOUS
  Filled 2022-08-06: qty 2

## 2022-08-06 MED ORDER — FENTANYL CITRATE PF 50 MCG/ML IJ SOSY
50.0000 ug | PREFILLED_SYRINGE | INTRAMUSCULAR | Status: DC
  Administered 2022-08-06: 50 ug via INTRAVENOUS
  Filled 2022-08-06: qty 2

## 2022-08-06 MED ORDER — PHENOL 1.4 % MT LIQD: 1.0000 | OROMUCOSAL | Status: DC | PRN

## 2022-08-06 MED ORDER — TAMSULOSIN HCL 0.4 MG PO CAPS
0.4000 mg | ORAL_CAPSULE | Freq: Every day | ORAL | Status: DC
  Administered 2022-08-07: 0.4 mg via ORAL
  Filled 2022-08-06: qty 1

## 2022-08-06 MED ORDER — DEXAMETHASONE SODIUM PHOSPHATE 4 MG/ML IJ SOLN
INTRAMUSCULAR | Status: DC | PRN
  Administered 2022-08-06: 8 mg via INTRAVENOUS

## 2022-08-06 MED ORDER — POVIDONE-IODINE 10 % EX SWAB: 2.0000 | Freq: Once | CUTANEOUS | Status: DC

## 2022-08-06 MED ORDER — DOCUSATE SODIUM 100 MG PO CAPS
100.0000 mg | ORAL_CAPSULE | Freq: Two times a day (BID) | ORAL | Status: DC
  Administered 2022-08-06 – 2022-08-07 (×2): 100 mg via ORAL
  Filled 2022-08-06 (×2): qty 1

## 2022-08-06 MED ORDER — ACETAMINOPHEN 500 MG PO TABS
1000.0000 mg | ORAL_TABLET | Freq: Once | ORAL | Status: AC
  Administered 2022-08-06: 1000 mg via ORAL
  Filled 2022-08-06: qty 2

## 2022-08-06 MED ORDER — DIPHENHYDRAMINE HCL 12.5 MG/5ML PO ELIX: 12.5000 mg | ORAL_SOLUTION | ORAL | Status: DC | PRN

## 2022-08-06 MED ORDER — IPRATROPIUM BROMIDE 0.03 % NA SOLN: 2.0000 | Freq: Every day | NASAL | Status: DC | PRN

## 2022-08-06 MED ORDER — ACETAMINOPHEN 160 MG/5ML PO SOLN: 325.0000 mg | ORAL | Status: DC | PRN

## 2022-08-06 MED ORDER — MENTHOL 3 MG MT LOZG: 1.0000 | LOZENGE | OROMUCOSAL | Status: DC | PRN

## 2022-08-06 MED ORDER — SENNA 8.6 MG PO TABS
1.0000 | ORAL_TABLET | Freq: Two times a day (BID) | ORAL | Status: DC
  Administered 2022-08-06 – 2022-08-07 (×2): 8.6 mg via ORAL
  Filled 2022-08-06 (×2): qty 1

## 2022-08-06 MED ORDER — SODIUM CHLORIDE (PF) 0.9 % IJ SOLN
INTRAMUSCULAR | Status: DC | PRN
  Administered 2022-08-06: 30 mL

## 2022-08-06 MED ORDER — ORAL CARE MOUTH RINSE: 15.0000 mL | Freq: Once | OROMUCOSAL | Status: AC

## 2022-08-06 MED ORDER — METOCLOPRAMIDE HCL 5 MG PO TABS: 5.0000 mg | ORAL_TABLET | Freq: Three times a day (TID) | ORAL | Status: DC | PRN

## 2022-08-06 MED ORDER — BISACODYL 10 MG RE SUPP: 10.0000 mg | Freq: Every day | RECTAL | Status: DC | PRN

## 2022-08-06 MED ORDER — EPHEDRINE SULFATE (PRESSORS) 50 MG/ML IJ SOLN
INTRAMUSCULAR | Status: DC | PRN
  Administered 2022-08-06 (×2): 10 mg via INTRAVENOUS
  Administered 2022-08-06: 20 mg via INTRAVENOUS

## 2022-08-06 MED ORDER — ONDANSETRON HCL 4 MG/2ML IJ SOLN
INTRAMUSCULAR | Status: DC | PRN
  Administered 2022-08-06: 4 mg via INTRAVENOUS

## 2022-08-06 MED ORDER — LACTATED RINGERS IV SOLN: INTRAVENOUS | Status: DC | PRN

## 2022-08-06 MED ORDER — BUPIVACAINE HCL (PF) 0.5 % IJ SOLN
INTRAMUSCULAR | Status: AC
  Filled 2022-08-06: qty 30

## 2022-08-06 MED ORDER — ATORVASTATIN CALCIUM 40 MG PO TABS
40.0000 mg | ORAL_TABLET | Freq: Every day | ORAL | Status: DC
  Administered 2022-08-06 – 2022-08-07 (×2): 40 mg via ORAL
  Filled 2022-08-06 (×2): qty 1

## 2022-08-06 MED ORDER — METHOCARBAMOL 500 MG IVPB - SIMPLE MED: 500.0000 mg | Freq: Four times a day (QID) | INTRAVENOUS | Status: DC | PRN

## 2022-08-06 MED ORDER — STERILE WATER FOR IRRIGATION IR SOLN
Status: DC | PRN
  Administered 2022-08-06 (×2): 1000 mL

## 2022-08-06 MED ORDER — ONDANSETRON HCL 4 MG PO TABS: 4.0000 mg | ORAL_TABLET | Freq: Four times a day (QID) | ORAL | Status: DC | PRN

## 2022-08-06 MED ORDER — BUPIVACAINE HCL (PF) 0.25 % IJ SOLN
INTRAMUSCULAR | Status: DC | PRN
  Administered 2022-08-06: 30 mL

## 2022-08-06 MED ORDER — SODIUM CHLORIDE 0.9 % IR SOLN
Status: DC | PRN
  Administered 2022-08-06 (×2): 1000 mL

## 2022-08-06 MED ORDER — POLYETHYLENE GLYCOL 3350 17 G PO PACK: 17.0000 g | PACK | Freq: Every day | ORAL | Status: DC | PRN

## 2022-08-06 MED ORDER — ACETAMINOPHEN 500 MG PO TABS
1000.0000 mg | ORAL_TABLET | Freq: Four times a day (QID) | ORAL | Status: DC
  Administered 2022-08-06 – 2022-08-07 (×3): 1000 mg via ORAL
  Filled 2022-08-06 (×3): qty 2

## 2022-08-06 MED ORDER — METOCLOPRAMIDE HCL 5 MG/ML IJ SOLN: 5.0000 mg | Freq: Three times a day (TID) | INTRAMUSCULAR | Status: DC | PRN

## 2022-08-06 MED ORDER — PANTOPRAZOLE SODIUM 40 MG PO TBEC
40.0000 mg | DELAYED_RELEASE_TABLET | Freq: Every day | ORAL | Status: DC
  Administered 2022-08-06: 40 mg via ORAL
  Filled 2022-08-06: qty 1

## 2022-08-06 MED ORDER — ALUM & MAG HYDROXIDE-SIMETH 200-200-20 MG/5ML PO SUSP: 30.0000 mL | ORAL | Status: DC | PRN

## 2022-08-06 MED ORDER — CEFAZOLIN SODIUM-DEXTROSE 2-4 GM/100ML-% IV SOLN
2.0000 g | Freq: Four times a day (QID) | INTRAVENOUS | Status: AC
  Administered 2022-08-06 – 2022-08-07 (×2): 2 g via INTRAVENOUS
  Filled 2022-08-06 (×2): qty 100

## 2022-08-06 MED ORDER — HYDROMORPHONE HCL 1 MG/ML IJ SOLN: 0.5000 mg | INTRAMUSCULAR | Status: DC | PRN

## 2022-08-06 MED ORDER — OXYCODONE HCL 5 MG/5ML PO SOLN: 5.0000 mg | Freq: Once | ORAL | Status: DC | PRN

## 2022-08-06 MED ORDER — AMLODIPINE BESYLATE 5 MG PO TABS
2.5000 mg | ORAL_TABLET | Freq: Every day | ORAL | Status: DC
  Administered 2022-08-07: 2.5 mg via ORAL
  Filled 2022-08-06: qty 1

## 2022-08-06 MED ORDER — OXYCODONE HCL 5 MG PO TABS: 5.0000 mg | ORAL_TABLET | Freq: Once | ORAL | Status: DC | PRN

## 2022-08-06 MED ORDER — PRIMIDONE 50 MG PO TABS
50.0000 mg | ORAL_TABLET | Freq: Two times a day (BID) | ORAL | Status: DC
  Administered 2022-08-06 – 2022-08-07 (×2): 50 mg via ORAL
  Filled 2022-08-06 (×2): qty 1

## 2022-08-06 MED ORDER — ACETAMINOPHEN 325 MG PO TABS: 325.0000 mg | ORAL_TABLET | Freq: Four times a day (QID) | ORAL | Status: DC | PRN

## 2022-08-06 MED ORDER — BUPIVACAINE IN DEXTROSE 0.75-8.25 % IT SOLN
INTRATHECAL | Status: DC | PRN
  Administered 2022-08-06: 1.8 mL via INTRATHECAL

## 2022-08-06 MED ORDER — ISOPROPYL ALCOHOL 70 % SOLN
Status: AC
  Filled 2022-08-06: qty 480

## 2022-08-06 MED ORDER — INSULIN ASPART 100 UNIT/ML IJ SOLN
0.0000 [IU] | Freq: Three times a day (TID) | INTRAMUSCULAR | Status: DC
  Administered 2022-08-07: 2 [IU] via SUBCUTANEOUS

## 2022-08-06 MED ORDER — CHLORHEXIDINE GLUCONATE 0.12 % MT SOLN
15.0000 mL | Freq: Once | OROMUCOSAL | Status: AC
  Administered 2022-08-06: 15 mL via OROMUCOSAL

## 2022-08-06 SURGICAL SUPPLY — 72 items
ADH SKN CLS APL DERMABOND .7 (GAUZE/BANDAGES/DRESSINGS) ×2
ADH SKN CLS LQ APL DERMABOND (GAUZE/BANDAGES/DRESSINGS) ×1
APL PRP STRL LF DISP 70% ISPRP (MISCELLANEOUS) ×2
BAG COUNTER SPONGE SURGICOUNT (BAG) IMPLANT
BAG SPEC THK2 15X12 ZIP CLS (MISCELLANEOUS)
BAG SPNG CNTER NS LX DISP (BAG)
BAG ZIPLOCK 12X15 (MISCELLANEOUS) IMPLANT
BATTERY INSTRU NAVIGATION (MISCELLANEOUS) ×6 IMPLANT
BLADE SAW RECIPROCATING 77.5 (BLADE) ×2 IMPLANT
BNDG CMPR 5X4 KNIT ELC UNQ LF (GAUZE/BANDAGES/DRESSINGS) ×1
BNDG CMPR 5X62 HK CLSR LF (GAUZE/BANDAGES/DRESSINGS) ×1
BNDG CMPR MED 10X6 ELC LF (GAUZE/BANDAGES/DRESSINGS) ×1
BNDG ELASTIC 4INX 5YD STR LF (GAUZE/BANDAGES/DRESSINGS) ×2 IMPLANT
BNDG ELASTIC 6INX 5YD STR LF (GAUZE/BANDAGES/DRESSINGS) ×2 IMPLANT
BNDG ELASTIC 6X10 VLCR STRL LF (GAUZE/BANDAGES/DRESSINGS) IMPLANT
BTRY SRG DRVR LF (MISCELLANEOUS) ×3
CHLORAPREP W/TINT 26 (MISCELLANEOUS) ×4 IMPLANT
COMP FEM PS KNEE STD 10 RT (Joint) ×1 IMPLANT
COMP TIB KNEE PS G 0 RT (Joint) ×1 IMPLANT
COMPONENT FEM PS KN STD 10 RT (Joint) IMPLANT
COMPONENT TIB KNEE PS G 0 RT (Joint) IMPLANT
COVER SURGICAL LIGHT HANDLE (MISCELLANEOUS) ×2 IMPLANT
DERMABOND ADVANCED .7 DNX12 (GAUZE/BANDAGES/DRESSINGS) ×4 IMPLANT
DERMABOND ADVANCED .7 DNX6 (GAUZE/BANDAGES/DRESSINGS) IMPLANT
DRAPE SHEET LG 3/4 BI-LAMINATE (DRAPES) ×6 IMPLANT
DRAPE U-SHAPE 47X51 STRL (DRAPES) ×2 IMPLANT
DRSG AQUACEL AG ADV 3.5X10 (GAUZE/BANDAGES/DRESSINGS) ×2 IMPLANT
ELECT BLADE TIP CTD 4 INCH (ELECTRODE) ×2 IMPLANT
ELECT REM PT RETURN 15FT ADLT (MISCELLANEOUS) ×2 IMPLANT
GAUZE SPONGE 4X4 12PLY STRL (GAUZE/BANDAGES/DRESSINGS) ×2 IMPLANT
GLOVE BIO SURGEON STRL SZ7 (GLOVE) ×2 IMPLANT
GLOVE BIO SURGEON STRL SZ8.5 (GLOVE) ×4 IMPLANT
GLOVE BIOGEL PI IND STRL 7.5 (GLOVE) ×2 IMPLANT
GLOVE BIOGEL PI IND STRL 8.5 (GLOVE) ×2 IMPLANT
GOWN SPEC L3 XXLG W/TWL (GOWN DISPOSABLE) ×2 IMPLANT
GOWN STRL REUS W/ TWL XL LVL3 (GOWN DISPOSABLE) ×2 IMPLANT
GOWN STRL REUS W/TWL XL LVL3 (GOWN DISPOSABLE) ×1
HANDPIECE INTERPULSE COAX TIP (DISPOSABLE) ×1
HOLDER FOLEY CATH W/STRAP (MISCELLANEOUS) ×2 IMPLANT
HOOD PEEL AWAY T7 (MISCELLANEOUS) ×6 IMPLANT
KIT TURNOVER KIT A (KITS) IMPLANT
LINER TIB PS GH/7-12 11 RT (Liner) IMPLANT
MARKER SKIN DUAL TIP RULER LAB (MISCELLANEOUS) ×2 IMPLANT
NDL SAFETY ECLIP 18X1.5 (MISCELLANEOUS) ×2 IMPLANT
NDL SPNL 18GX3.5 QUINCKE PK (NEEDLE) ×2 IMPLANT
NEEDLE SPNL 18GX3.5 QUINCKE PK (NEEDLE) ×1 IMPLANT
NS IRRIG 1000ML POUR BTL (IV SOLUTION) ×2 IMPLANT
PACK TOTAL KNEE CUSTOM (KITS) ×2 IMPLANT
PADDING CAST COTTON 6X4 STRL (CAST SUPPLIES) ×2 IMPLANT
PATELLA STD SZ 38X10 (Miscellaneous) IMPLANT
PROTECTOR NERVE ULNAR (MISCELLANEOUS) ×2 IMPLANT
SAW OSC TIP CART 19.5X105X1.3 (SAW) ×2 IMPLANT
SCREW FEMALE HEX FIX 25X2.5 (ORTHOPEDIC DISPOSABLE SUPPLIES) IMPLANT
SEALER BIPOLAR AQUA 6.0 (INSTRUMENTS) ×2 IMPLANT
SET HNDPC FAN SPRY TIP SCT (DISPOSABLE) ×2 IMPLANT
SET PAD KNEE POSITIONER (MISCELLANEOUS) ×2 IMPLANT
SOLUTION PRONTOSAN WOUND 350ML (IRRIGATION / IRRIGATOR) IMPLANT
SPIKE FLUID TRANSFER (MISCELLANEOUS) ×4 IMPLANT
SUT MNCRL AB 3-0 PS2 18 (SUTURE) ×2 IMPLANT
SUT MON AB 2-0 CT1 36 (SUTURE) ×2 IMPLANT
SUT STRATAFIX PDO 1 14 VIOLET (SUTURE) ×1
SUT STRATFX PDO 1 14 VIOLET (SUTURE) ×1
SUT VIC AB 1 CTX 36 (SUTURE) ×2
SUT VIC AB 1 CTX36XBRD ANBCTR (SUTURE) ×4 IMPLANT
SUT VIC AB 2-0 CT1 27 (SUTURE) ×1
SUT VIC AB 2-0 CT1 TAPERPNT 27 (SUTURE) ×2 IMPLANT
SUTURE STRATFX PDO 1 14 VIOLET (SUTURE) ×2 IMPLANT
SYR 3ML LL SCALE MARK (SYRINGE) ×2 IMPLANT
TRAY FOLEY MTR SLVR 16FR STAT (SET/KITS/TRAYS/PACK) IMPLANT
TUBE SUCTION HIGH CAP CLEAR NV (SUCTIONS) ×2 IMPLANT
WATER STERILE IRR 1000ML POUR (IV SOLUTION) ×4 IMPLANT
WRAP KNEE MAXI GEL POST OP (GAUZE/BANDAGES/DRESSINGS) IMPLANT

## 2022-08-06 NOTE — Anesthesia Preprocedure Evaluation (Addendum)
Anesthesia Evaluation  Patient identified by MRN, date of birth, ID band Patient awake    Reviewed: Allergy & Precautions, NPO status , Patient's Chart, lab work & pertinent test results, reviewed documented beta blocker date and time   Airway Mallampati: II  TM Distance: >3 FB Neck ROM: Full    Dental  (+) Dental Advisory Given,    Pulmonary pneumonia, former smoker   Pulmonary exam normal breath sounds clear to auscultation       Cardiovascular hypertension, Pt. on medications and Pt. on home beta blockers Normal cardiovascular exam Rhythm:Regular Rate:Normal     Neuro/Psych Essential tremor     GI/Hepatic negative GI ROS, Neg liver ROS,,,  Endo/Other  diabetes, Type 2, Oral Hypoglycemic Agents, Insulin DependentHypothyroidism  Morbid obesityObesity   Renal/GU Renal Insufficiency and CRFRenal disease     Musculoskeletal  (+) Arthritis , Osteoarthritis,    Abdominal   Peds  Hematology negative hematology ROS (+) Blood dyscrasia, anemia   Anesthesia Other Findings   Reproductive/Obstetrics                             Anesthesia Physical Anesthesia Plan  ASA: 3  Anesthesia Plan: MAC, Regional and Spinal   Post-op Pain Management: Regional block*   Induction: Intravenous  PONV Risk Score and Plan: 3 and Dexamethasone, Ondansetron, Treatment may vary due to age or medical condition, Midazolam and Propofol infusion  Airway Management Planned: Natural Airway and Simple Face Mask  Additional Equipment: None  Intra-op Plan:   Post-operative Plan: Extubation in OR  Informed Consent: I have reviewed the patients History and Physical, chart, labs and discussed the procedure including the risks, benefits and alternatives for the proposed anesthesia with the patient or authorized representative who has indicated his/her understanding and acceptance.     Dental advisory given  Plan  Discussed with: CRNA and Anesthesiologist  Anesthesia Plan Comments:         Anesthesia Quick Evaluation

## 2022-08-06 NOTE — Transfer of Care (Signed)
Immediate Anesthesia Transfer of Care Note  Patient: [name]  Procedure(s) Performed: COMPUTER ASSISTED TOTAL KNEE ARTHROPLASTY (Right: Knee)  Patient Location: PACU  Anesthesia Type:MAC combined with regional for post-op pain  Level of Consciousness: awake and alert   Airway & Oxygen Therapy: Patient Spontanous Breathing and Patient connected to face mask oxygen  Post-op Assessment: Report given to RN and Post -op Vital signs reviewed and stable  Post vital signs: Reviewed and stable  Last Vitals:  Vitals Value Taken Time  BP 115/62 08/06/22 1510  Temp    Pulse 62 08/06/22 1511  Resp 15 08/06/22 1511  SpO2 99 % 08/06/22 1511  Vitals shown include unvalidated device data.  Last Pain:  Vitals:   08/06/22 1129  TempSrc:   PainSc: 0-No pain         Complications: No notable events documented.

## 2022-08-06 NOTE — Plan of Care (Signed)
  Problem: Education: Goal: Ability to describe self-care measures that may prevent or decrease complications (Diabetes Survival Skills Education) will improve Outcome: Progressing   Problem: Coping: Goal: Ability to adjust to condition or change in health will improve Outcome: Progressing   Problem: Nutritional: Goal: Maintenance of adequate nutrition will improve Outcome: Progressing   Problem: Education: Goal: Knowledge of the prescribed therapeutic regimen will improve Outcome: Progressing   Problem: Activity: Goal: Ability to avoid complications of mobility impairment will improve Outcome: Progressing   Problem: Pain Management: Goal: Pain level will decrease with appropriate interventions Outcome: Progressing

## 2022-08-06 NOTE — Interval H&P Note (Signed)
History and Physical Interval Note:  08/06/2022 10:26 AM  [name]  has presented today for surgery, with the diagnosis of Right knee osteoarthritis.  The various methods of treatment have been discussed with the patient and family. After consideration of risks, benefits and other options for treatment, the patient has consented to  Procedure(s) with comments: COMPUTER ASSISTED TOTAL KNEE ARTHROPLASTY (Right) - 150 as a surgical intervention.  The patient's history has been reviewed, patient examined, no change in status, stable for surgery.  I have reviewed the patient's chart and labs.  Questions were answered to the patient's satisfaction.     Iline Oven Breckin Zafar

## 2022-08-06 NOTE — Anesthesia Procedure Notes (Signed)
Anesthesia Regional Block: Adductor canal block   Pre-Anesthetic Checklist: , timeout performed,  Correct Patient, Correct Site, Correct Laterality,  Correct Procedure, Correct Position, site marked,  Risks and benefits discussed,  Surgical consent,  Pre-op evaluation,  At surgeon's request and post-op pain management  Laterality: Right  Prep: chloraprep       Needles:  Injection technique: Single-shot  Needle Type: Echogenic Stimulator Needle     Needle Length: 5cm  Needle Gauge: 22     Additional Needles:   Procedures:,,,, ultrasound used (permanent image in chart),,    Narrative:  Start time: 08/06/2022 11:20 AM End time: 08/06/2022 11:26 AM Injection made incrementally with aspirations every 5 mL.  Performed by: Personally  Anesthesiologist: Bethena Midget, MD  Additional Notes: Functioning IV was confirmed and monitors were applied.  A 50mm 22ga Arrow echogenic stimulator needle was used. Sterile prep and drape,hand hygiene and sterile gloves were used. Ultrasound guidance: relevant anatomy identified, needle position confirmed, local anesthetic spread visualized around nerve(s)., vascular puncture avoided.  Image printed for medical record. Negative aspiration and negative test dose prior to incremental administration of local anesthetic. The patient tolerated the procedure well.

## 2022-08-06 NOTE — Discharge Instructions (Signed)
 Dr. Brian Swinteck Total Joint Specialist [facility] [address] [place], [place] [address] [phone]  TOTAL KNEE REPLACEMENT POSTOPERATIVE DIRECTIONS    Knee Rehabilitation, Guidelines Following Surgery  Results after knee surgery are often greatly improved when you follow the exercise, range of motion and muscle strengthening exercises prescribed by your doctor. Safety measures are also important to protect the knee from further injury. Any time any of these exercises cause you to have increased pain or swelling in your knee joint, decrease the amount until you are comfortable again and slowly increase them. If you have problems or questions, call your caregiver or physical therapist for advice.   WEIGHT BEARING Weight bearing as tolerated with assist device (Callari, cane, etc) as directed, use it as long as suggested by your surgeon or therapist, typically at least 4-6 weeks.  HOME CARE INSTRUCTIONS  Remove items at home which could result in a fall. This includes throw rugs or furniture in walking pathways.  Continue medications as instructed at time of discharge. You may have some home medications which will be placed on hold until you complete the course of blood thinner medication.  You may start showering once you are discharged home but do not submerge the incision under water. Just pat the incision dry and apply a dry gauze dressing on daily. Walk with Kilian as instructed.  You may resume a sexual relationship in one month or when given the OK by your doctor.  Use [place] as long as suggested by your caregivers. Avoid periods of inactivity such as sitting longer than an hour when not asleep. This helps prevent blood clots.  You may put full weight on your legs and walk as much as is comfortable.  You may return to work once you are cleared by your doctor.  Do not drive a car for 6 weeks or until released by you surgeon.  Do not drive while  taking narcotics.  Wear the elastic stockings for three weeks following surgery during the day but you may remove then at night. Make sure you keep all of your appointments after your operation with all of your doctors and caregivers. You should call the office at the above phone number and make an appointment for approximately two weeks after the date of your surgery. Do not remove your surgical dressing. The dressing is waterproof; you may take showers in 3 days, but do not take tub baths or submerge the dressing. Please pick up a stool softener and laxative for home use as long as you are requiring pain medications. ICE to the affected knee every three hours for 30 minutes at a time and then as needed for pain and swelling.  Continue to use ice on the knee for pain and swelling from surgery. You may notice swelling that will progress down to the foot and ankle.  This is normal after surgery.  Elevate the leg when you are not up walking on it.   It is important for you to complete the blood thinner medication as prescribed by your doctor. Continue to use the breathing machine which will help keep your temperature down.  It is common for your temperature to cycle up and down following surgery, especially at night when you are not up moving around and exerting yourself.  The breathing machine keeps your lungs expanded and your temperature down.  RANGE OF MOTION AND STRENGTHENING EXERCISES  Rehabilitation of the knee is important following a knee injury or an   operation. After just a few days of immobilization, the muscles of the thigh which control the knee become weakened and shrink (atrophy). Knee exercises are designed to build up the tone and strength of the thigh muscles and to improve knee motion. Often times heat used for twenty to thirty minutes before working out will loosen up your tissues and help with improving the range of motion but do not use heat for the first two weeks following surgery.  These exercises can be done on a training (exercise) mat, on the floor, on a table or on a bed. Use what ever works the best and is most comfortable for you Knee exercises include:  Leg Lifts - While your knee is still immobilized in a splint or cast, you can do straight leg raises. Lift the leg to 60 degrees, hold for 3 sec, and slowly lower the leg. Repeat 10-20 times 2-3 times daily. Perform this exercise against resistance later as your knee gets better.  Quad and Hamstring Sets - Tighten up the muscle on the front of the thigh (Quad) and hold for 5-10 sec. Repeat this 10-20 times hourly. Hamstring sets are done by pushing the foot backward against an object and holding for 5-10 sec. Repeat as with quad sets.  A rehabilitation program following serious knee injuries can speed recovery and prevent re-injury in the future due to weakened muscles. Contact your doctor or a physical therapist for more information on knee rehabilitation.   POST-OPERATIVE OPIOID TAPER INSTRUCTIONS: It is important to wean off of your opioid medication as soon as possible. If you do not need pain medication after your surgery it is ok to stop day one. Opioids include: Codeine, Hydrocodone(Norco, Vicodin), Oxycodone(Percocet, oxycontin) and hydromorphone amongst others.  Long term and even short term use of opiods can cause: Increased pain response Dependence Constipation Depression Respiratory depression And more.  Withdrawal symptoms can include Flu like symptoms Nausea, vomiting And more Techniques to manage these symptoms Hydrate well Eat regular healthy meals Stay active Use relaxation techniques(deep breathing, meditating, yoga) Do Not substitute Alcohol to help with tapering If you have been on opioids for less than two weeks and do not have pain than it is ok to stop all together.  Plan to wean off of opioids This plan should start within one week post op of your joint replacement. Maintain the same  interval or time between taking each dose and first decrease the dose.  Cut the total daily intake of opioids by one tablet each day Next start to increase the time between doses. The last dose that should be eliminated is the evening dose.    SKILLED REHAB INSTRUCTIONS: If the patient is transferred to a skilled rehab facility following release from the hospital, a list of the current medications will be sent to the facility for the patient to continue.  When discharged from the skilled rehab facility, please have the facility set up the patient's Home Health Physical Therapy prior to being released. Also, the skilled facility will be responsible for providing the patient with their medications at time of release from the facility to include their pain medication, the muscle relaxants, and their blood thinner medication. If the patient is still at the rehab facility at time of the two week follow up appointment, the skilled rehab facility will also need to assist the patient in arranging follow up appointment in our office and any transportation needs.  MAKE SURE YOU:  Understand these instructions.  Will watch   your condition.  Will get help right away if you are not doing well or get worse.    Pick up stool softner and laxative for home use following surgery while on pain medications. Do NOT remove your dressing. You may shower.  Do not take tub baths or submerge incision under water. May shower starting three days after surgery. Please use a clean towel to pat the incision dry following showers. Continue to use ice for pain and swelling after surgery. Do not use any lotions or creams on the incision until instructed by your surgeon.  

## 2022-08-06 NOTE — Op Note (Signed)
OPERATIVE REPORT  SURGEON: Samson Frederic, MD   ASSISTANT: Clint Bolder, PA-C  PREOPERATIVE DIAGNOSIS: Primary Right knee arthritis.   POSTOPERATIVE DIAGNOSIS: Primary Right knee arthritis.   PROCEDURE: Computer assisted Right total knee arthroplasty.   IMPLANTS: Zimmer Persona PPS Cementless CR femur, size 10. Persona 0 degree Spiked Keel OsseoTi Tibia, size G. Vivacit-E polyethelyene insert, size 11 mm, CR. TM standard patella, size 38 mm.  ANESTHESIA:  MAC, Regional, and Spinal  TOURNIQUET TIME: Not utilized.   ESTIMATED BLOOD LOSS:-250 mL    ANTIBIOTICS: 2 g Ancef.  DRAINS: None.  COMPLICATIONS: None   CONDITION: PACU - hemodynamically stable.   BRIEF CLINICAL NOTE: [name] is a 77 y.o. male with a long-standing history of Right knee arthritis. After failing conservative management, the patient was indicated for total knee arthroplasty. The risks, benefits, and alternatives to the procedure were explained, and the patient elected to proceed.  PROCEDURE IN DETAIL: Adductor canal block was obtained in the pre-op holding area. Once inside the operative room, spinal anesthesia was obtained, and a foley catheter was inserted. The patient was then positioned and the lower extremity was prepped and draped in the normal sterile surgical fashion.  A time-out was called verifying side and site of surgery. The patient received IV antibiotics within 60 minutes of beginning the procedure. A tourniquet was not utilized.   An anterior approach to the knee was performed utilizing a midvastus arthrotomy. A medial release was performed and the patellar fat pad was excised. Stryker imageless navigation was used to cut the distal femur perpendicular to the mechanical axis. A freehand patellar resection was performed, and the patella was sized an prepared with a lug hole.  Nagivation was used to make a neutral proximal tibia resection, taking 8 mm of bone from the less affected lateral  side with 3 degrees of slope. The menisci were excised. A spacer block was placed, and the alignment and balance in extension were confirmed.   The distal femur was sized using the 3-degree external rotation guide referencing the posterior femoral cortex. The appropriate 4-in-1 cutting block was pinned into place. Rotation was checked using Whiteside's line, the epicondylar axis, and then confirmed with a spacer block in flexion. The remaining femoral cuts were performed, taking care to protect the MCL.  The tibia was sized and the trial tray was pinned into place. The remaining trail components were inserted. The knee was stable to varus and valgus stress through a full range of motion. The patella tracked centrally, and the PCL was well balanced. The trial components were removed, and the proximal tibial surface was prepared. Final components were impacted into place. The knee was tested for a final time and found to be well balanced.   The wound was copiously irrigated with Prontosan solution and normal saline using pule lavage.  Marcaine solution was injected into the periarticular soft tissue.  The wound was closed in layers using #1 Vicryl and Stratafix for the fascia, 2-0 Vicryl for the subcutaneous fat, 2-0 Monocryl for the deep dermal layer, and skin staples. Dermabond was applied to the skin.  Once the glue was fully dried, an Aquacell Ag and compressive dressing were applied.  The patient was transported to the recovery room in stable condition.  Sponge, needle, and instrument counts were correct at the end of the case x2.  The patient tolerated the procedure well and there were no known complications.  The aquamantis was utilized for this case to help facilitate better  hemostasis as patient was felt to be at increased risk of bleeding because of preop anemia, no tourniquet use.   A oscillating saw tip was utilized for this case to prevent damage to the soft tissue structures such as muscles,  ligaments and tendons, and to ensure accurate bone cuts. This patient was at increased risk for above structures due to  minimally invasive approach.  Please note that a surgical assistant was a medical necessity for this procedure in order to perform it in a safe and expeditious manner. Surgical assistant was necessary to retract the ligaments and vital neurovascular structures to prevent injury to them and also necessary for proper positioning of the limb to allow for anatomic placement of the prosthesis.

## 2022-08-06 NOTE — Anesthesia Procedure Notes (Signed)
Spinal  Patient location during procedure: OR Start time: 08/06/2022 12:43 PM End time: 08/06/2022 12:43 PM Reason for block: surgical anesthesia Staffing Anesthesiologist: Bethena Midget, MD Performed by: Bethena Midget, MD Authorized by: Bethena Midget, MD   Preanesthetic Checklist Completed: patient identified, IV checked, site marked, risks and benefits discussed, surgical consent, monitors and equipment checked, pre-op evaluation and timeout performed Spinal Block Patient position: sitting Prep: DuraPrep Patient monitoring: heart rate, cardiac monitor, continuous pulse ox and blood pressure Approach: midline Location: L2-3 Injection technique: single-shot Needle Needle type: Sprotte  Needle gauge: 24 G Needle length: 9 cm Assessment Sensory level: T4 Events: CSF return

## 2022-08-07 DIAGNOSIS — M1711 Unilateral primary osteoarthritis, right knee: Secondary | ICD-10-CM | POA: Diagnosis not present

## 2022-08-07 LAB — GLUCOSE, CAPILLARY: Glucose-Capillary: 164 mg/dL — ABNORMAL HIGH (ref 70–99)

## 2022-08-07 LAB — BASIC METABOLIC PANEL
Anion gap: 8 (ref 5–15)
BUN: 29 mg/dL — ABNORMAL HIGH (ref 8–23)
CO2: 22 mmol/L (ref 22–32)
Calcium: 8.2 mg/dL — ABNORMAL LOW (ref 8.9–10.3)
Chloride: 104 mmol/L (ref 98–111)
Creatinine, Ser: 1.43 mg/dL — ABNORMAL HIGH (ref 0.61–1.24)
GFR, Estimated: 50 mL/min — ABNORMAL LOW (ref >60)
Glucose, Bld: 149 mg/dL — ABNORMAL HIGH (ref 70–99)
Potassium: 4.3 mmol/L (ref 3.5–5.1)
Sodium: 134 mmol/L — ABNORMAL LOW (ref 135–145)

## 2022-08-07 LAB — CBC
HCT: 36.9 % — ABNORMAL LOW (ref 39.0–52.0)
Hemoglobin: 11.8 g/dL — ABNORMAL LOW (ref 13.0–17.0)
MCH: 31.1 pg (ref 26.0–34.0)
MCHC: 32 g/dL (ref 30.0–36.0)
MCV: 97.1 fL (ref 80.0–100.0)
Platelets: 201 10*3/uL (ref 150–400)
RBC: 3.8 MIL/uL — ABNORMAL LOW (ref 4.22–5.81)
RDW: 13.2 % (ref 11.5–15.5)
WBC: 17.4 10*3/uL — ABNORMAL HIGH (ref 4.0–10.5)
nRBC: 0 % (ref 0.0–0.2)

## 2022-08-07 MED ORDER — ONDANSETRON HCL 4 MG PO TABS: 4.0000 mg | ORAL_TABLET | Freq: Three times a day (TID) | ORAL | 0 refills | Status: AC | PRN

## 2022-08-07 MED ORDER — ACETAMINOPHEN 500 MG PO TABS: 1000.0000 mg | ORAL_TABLET | Freq: Three times a day (TID) | ORAL | 0 refills | Status: AC | PRN

## 2022-08-07 MED ORDER — POLYETHYLENE GLYCOL 3350 17 G PO PACK: 17.0000 g | PACK | Freq: Every day | ORAL | 0 refills | Status: AC | PRN

## 2022-08-07 MED ORDER — DOCUSATE SODIUM 100 MG PO CAPS: 100.0000 mg | ORAL_CAPSULE | Freq: Two times a day (BID) | ORAL | 0 refills | Status: AC

## 2022-08-07 MED ORDER — SENNA 8.6 MG PO TABS: 2.0000 | ORAL_TABLET | Freq: Every day | ORAL | 0 refills | Status: AC

## 2022-08-07 MED ORDER — ASPIRIN 81 MG PO CHEW: 81.0000 mg | CHEWABLE_TABLET | Freq: Two times a day (BID) | ORAL | 0 refills | Status: AC

## 2022-08-07 MED ORDER — OXYCODONE HCL 5 MG PO TABS: 5.0000 mg | ORAL_TABLET | ORAL | 0 refills | Status: AC | PRN

## 2022-08-07 NOTE — Progress Notes (Signed)
    Subjective:  Patient reports pain as mild to moderate.  Denies N/V/CP/SOB/Abd pain. He denies any tingling or numbness in LE bilaterally. He reports his pain is well controlled with the medication.   Objective:   VITALS:   Vitals:   08/06/22 1826 08/06/22 2105 08/07/22 0124 08/07/22 0501  BP: (!) 148/62 (!) 153/57 (!) 146/52 (!) 146/58  Pulse: 73 83 73 60  Resp: 18 17 17 17   Temp: 98.3 F (36.8 C) 98 F (36.7 C) 97.9 F (36.6 C) 98 F (36.7 C)  TempSrc:  Oral Oral Oral  SpO2: 95% 94% 95% 99%  Weight:      Height:        Patient sitting up in bed. NAD.  Neurologically intact ABD soft Neurovascular intact Sensation intact distally Intact pulses distally Dorsiflexion/Plantar flexion intact Incision: dressing C/D/I No cellulitis present Compartment soft   Lab Results  Component Value Date   WBC 17.4 (H) 08/07/2022   HGB 11.8 (L) 08/07/2022   HCT 36.9 (L) 08/07/2022   MCV 97.1 08/07/2022   PLT 201 08/07/2022   BMET    Component Value Date/Time   NA 134 (L) 08/07/2022 0330   K 4.3 08/07/2022 0330   CL 104 08/07/2022 0330   CO2 22 08/07/2022 0330   GLUCOSE 149 (H) 08/07/2022 0330   BUN 29 (H) 08/07/2022 0330   CREATININE 1.43 (H) 08/07/2022 0330   CREATININE 1.32 01/04/2014 1015   CALCIUM 8.2 (L) 08/07/2022 0330   GFRNONAA 50 (L) 08/07/2022 0330     Assessment/Plan: 1 Day Post-Op   Principal Problem:   Primary osteoarthritis of right knee Active Problems:   Osteoarthritis of right knee  CKD: Creatinine 1.43 today (1.43 prior to admission). Continue to monitor.   WBAT with Roop DVT ppx: Aspirin, SCDs, TEDS PO pain control PT/OT: Pt has not seen yet. PT to come by today.  Dispo: D/c home once cleared with PT.    Clois Dupes, PA-C 08/07/2022, 8:46 AM   [facility]  [place] [address], [address], [place], [place] [address] Phone: [phone] [email or web address] Facebook  [employer]

## 2022-08-07 NOTE — Discharge Summary (Signed)
Physician Discharge Summary  Patient ID: [name] MRN: [id number] DOB/AGE: [birth year] 79 y.o.  Admit date: 08/06/2022 Discharge date: 08/07/2022  Admission Diagnoses:  Primary osteoarthritis of right knee  Discharge Diagnoses:  Principal Problem:   Primary osteoarthritis of right knee Active Problems:   Osteoarthritis of right knee   Past Medical History:  Diagnosis Date   Benign localized prostatic hyperplasia with lower urinary tract symptoms (LUTS)    CKD (chronic kidney disease), stage III (HCC)    followed by pcp   ED (erectile dysfunction)    Essential tremor    History of chronic prostatitis    History of colonic polyps    History of urinary retention    Hyperlipidemia    Hypertension    followed by pcp   Hypothyroidism    followed by pcp   OA (osteoarthritis)    knees   Phimosis    Pneumonia    2014   Type 2 diabetes mellitus treated with insulin (HCC)    followed by pcp and endocrinologist--- dr w. Katrinka Blazing   (12-05-2020  per pt checks blood sugar daily in am,  fasting surgery-- 85--120)   Wears hearing aid in both ears     Surgeries: Procedure(s): COMPUTER ASSISTED TOTAL KNEE ARTHROPLASTY on 08/06/2022   Consultants (if any):   Discharged Condition: Improved  Hospital Course: JAELYNN Chase is an 77 y.o. male who was admitted 08/06/2022 with a diagnosis of Primary osteoarthritis of right knee and went to the operating room on 08/06/2022 and underwent the above named procedures.    He was given perioperative antibiotics:  Anti-infectives (From admission, onward)    Start     Dose/Rate Route Frequency Ordered Stop   08/06/22 2000  ceFAZolin (ANCEF) IVPB 2g/100 mL premix        2 g 200 mL/hr over 30 Minutes Intravenous Every 6 hours 08/06/22 1636 08/07/22 0209   08/06/22 0945  ceFAZolin (ANCEF) IVPB 2g/100 mL premix        2 g 200 mL/hr over 30 Minutes Intravenous On call to O.R. 08/06/22 0933 08/06/22 1334       He was given sequential  compression devices, early ambulation, and aspirin for DVT prophylaxis.  POD#1 He ambulated well with PT and was discharged home.   He benefited maximally from the hospital stay and there were no complications.    Recent vital signs:  Vitals:   08/07/22 0501 08/07/22 0955  BP: (!) 146/58 (!) 135/58  Pulse: 60 (!) 56  Resp: 17 17  Temp: 98 F (36.7 C) 98 F (36.7 C)  SpO2: 99% 95%    Recent laboratory studies:  Lab Results  Component Value Date   HGB 11.8 (L) 08/07/2022   HGB 13.4 07/27/2022   HGB 15.0 12/09/2020   Lab Results  Component Value Date   WBC 17.4 (H) 08/07/2022   PLT 201 08/07/2022   Lab Results  Component Value Date   INR 1.13 06/25/2012   Lab Results  Component Value Date   NA 134 (L) 08/07/2022   K 4.3 08/07/2022   CL 104 08/07/2022   CO2 22 08/07/2022   BUN 29 (H) 08/07/2022   CREATININE 1.43 (H) 08/07/2022   GLUCOSE 149 (H) 08/07/2022     Allergies as of 08/07/2022       Reactions   Sulfa Antibiotics Hives, Shortness Of Breath, Rash   Other reaction(s): rash   Morphine Nausea And Vomiting   Morphine And Codeine Nausea And Vomiting  Morphine Sulfate    Other reaction(s): vomiting        Medication List     TAKE these medications    acetaminophen 500 MG tablet Commonly known as: TYLENOL Take 2 tablets (1,000 mg total) by mouth every 8 (eight) hours as needed for mild pain, moderate pain, fever or headache. What changed:  when to take this reasons to take this   Alogliptin Benzoate 25 MG Tabs Take 25 mg by mouth daily.   amLODipine 2.5 MG tablet Commonly known as: NORVASC Take 2.5 mg by mouth daily.   aspirin 81 MG chewable tablet Commonly known as: Aspirin Childrens Chew 1 tablet (81 mg total) by mouth 2 (two) times daily with a meal.   chlorpheniramine 4 MG tablet Commonly known as: CHLOR-TRIMETON Take 4 mg by mouth 2 (two) times daily as needed for allergies.   cholecalciferol 25 MCG (1000 UNIT) tablet Commonly  known as: VITAMIN D3 Take 1,000 Units by mouth daily.   docusate sodium 100 MG capsule Commonly known as: Colace Take 1 capsule (100 mg total) by mouth 2 (two) times daily.   doxazosin 4 MG tablet Commonly known as: CARDURA Take 2 mg by mouth in the morning.   empagliflozin 25 MG Tabs tablet Commonly known as: JARDIANCE Take 25 mg by mouth daily.   glipiZIDE 5 MG tablet Commonly known as: GLUCOTROL Take 5 mg by mouth daily before breakfast.   Glucosamine Chondroitin Complx Caps Take 1 capsule by mouth 2 (two) times daily.   insulin glargine 100 UNIT/ML injection Commonly known as: LANTUS Inject 36 Units into the skin at bedtime.   ipratropium 0.03 % nasal spray Commonly known as: ATROVENT Place 2 sprays into both nostrils daily as needed for rhinitis.   levothyroxine 137 MCG tablet Commonly known as: SYNTHROID Take 137 mcg by mouth daily before breakfast.   losartan 100 MG tablet Commonly known as: COZAAR Take 100 mg by mouth at bedtime.   metFORMIN 500 MG (MOD) 24 hr tablet Commonly known as: GLUMETZA Take 500 mg by mouth 2 (two) times daily with a meal.   multivitamin with minerals tablet Take 1 tablet by mouth daily.   ondansetron 4 MG tablet Commonly known as: Zofran Take 1 tablet (4 mg total) by mouth every 8 (eight) hours as needed for nausea or vomiting.   oxyCODONE 5 MG immediate release tablet Commonly known as: Roxicodone Take 1 tablet (5 mg total) by mouth every 4 (four) hours as needed for severe pain.   polyethylene glycol 17 g packet Commonly known as: MiraLax Take 17 g by mouth daily as needed for mild constipation or moderate constipation.   primidone 50 MG tablet Commonly known as: MYSOLINE Take 50 mg by mouth 2 (two) times daily.   propranolol 20 MG tablet Commonly known as: INDERAL Take 20 mg by mouth daily.   senna 8.6 MG Tabs tablet Commonly known as: SENOKOT Take 2 tablets (17.2 mg total) by mouth at bedtime for 15 days.    simvastatin 80 MG tablet Commonly known as: ZOCOR Take 80 mg by mouth at bedtime.   tamsulosin 0.4 MG Caps capsule Commonly known as: FLOMAX Take 0.4 mg by mouth daily.               Discharge Care Instructions  (From admission, onward)           Start     Ordered   08/07/22 0000  Weight bearing as tolerated  08/07/22 0852   08/07/22 0000  Change dressing       Comments: Do not remove your dressing.   08/07/22 0852              WEIGHT BEARING   Weight bearing as tolerated with assist device (Laughery, cane, etc) as directed, use it as long as suggested by your surgeon or therapist, typically at least 4-6 weeks.   EXERCISES  Results after joint replacement surgery are often greatly improved when you follow the exercise, range of motion and muscle strengthening exercises prescribed by your doctor. Safety measures are also important to protect the joint from further injury. Any time any of these exercises cause you to have increased pain or swelling, decrease what you are doing until you are comfortable again and then slowly increase them. If you have problems or questions, call your caregiver or physical therapist for advice.   Rehabilitation is important following a joint replacement. After just a few days of immobilization, the muscles of the leg can become weakened and shrink (atrophy).  These exercises are designed to build up the tone and strength of the thigh and leg muscles and to improve motion. Often times heat used for twenty to thirty minutes before working out will loosen up your tissues and help with improving the range of motion but do not use heat for the first two weeks following surgery (sometimes heat can increase post-operative swelling).   These exercises can be done on a training (exercise) mat, on the floor, on a table or on a bed. Use whatever works the best and is most comfortable for you.    Use music or television while you are exercising  so that the exercises are a pleasant break in your day. This will make your life better with the exercises acting as a break in your routine that you can look forward to.   Perform all exercises about fifteen times, three times per day or as directed.  You should exercise both the operative leg and the other leg as well.  Exercises include:   Quad Sets - Tighten up the muscle on the front of the thigh (Quad) and hold for 5-10 seconds.   Straight Leg Raises - With your knee straight (if you were given a brace, keep it on), lift the leg to 60 degrees, hold for 3 seconds, and slowly lower the leg.  Perform this exercise against resistance later as your leg gets stronger.  Leg Slides: Lying on your back, slowly slide your foot toward your buttocks, bending your knee up off the floor (only go as far as is comfortable). Then slowly slide your foot back down until your leg is flat on the floor again.  Angel Wings: Lying on your back spread your legs to the side as far apart as you can without causing discomfort.  Hamstring Strength:  Lying on your back, push your heel against the floor with your leg straight by tightening up the muscles of your buttocks.  Repeat, but this time bend your knee to a comfortable angle, and push your heel against the floor.  You may put a pillow under the heel to make it more comfortable if necessary.   A rehabilitation program following joint replacement surgery can speed recovery and prevent re-injury in the future due to weakened muscles. Contact your doctor or a physical therapist for more information on knee rehabilitation.    CONSTIPATION  Constipation is defined medically as fewer than three stools per  week and severe constipation as less than one stool per week.  Even if you have a regular bowel pattern at home, your normal regimen is likely to be disrupted due to multiple reasons following surgery.  Combination of anesthesia, postoperative narcotics, change in appetite  and fluid intake all can affect your bowels.   YOU MUST use at least one of the following options; they are listed in order of increasing strength to get the job done.  They are all available over the counter, and you may need to use some, POSSIBLY even all of these options:    Drink plenty of fluids (prune juice may be helpful) and high fiber foods Colace 100 mg by mouth twice a day  Senokot for constipation as directed and as needed Dulcolax (bisacodyl), take with full glass of water  Miralax (polyethylene glycol) once or twice a day as needed.  If you have tried all these things and are unable to have a bowel movement in the first 3-4 days after surgery call either your surgeon or your primary doctor.    If you experience loose stools or diarrhea, hold the medications until you stool forms back up.  If your symptoms do not get better within 1 week or if they get worse, check with your doctor.  If you experience "the worst abdominal pain ever" or develop nausea or vomiting, please contact the office immediately for further recommendations for treatment.   ITCHING:  If you experience itching with your medications, try taking only a single pain pill, or even half a pain pill at a time.  You can also use Benadryl over the counter for itching or also to help with sleep.   TED HOSE STOCKINGS:  Use stockings on both legs until for at least 2 weeks or as directed by physician office. They may be removed at night for sleeping.  MEDICATIONS:  See your medication summary on the "After Visit Summary" that nursing will review with you.  You may have some home medications which will be placed on hold until you complete the course of blood thinner medication.  It is important for you to complete the blood thinner medication as prescribed.  PRECAUTIONS:  If you experience chest pain or shortness of breath - call 911 immediately for transfer to the hospital emergency department.   If you develop a fever  greater that 101 F, purulent drainage from wound, increased redness or drainage from wound, foul odor from the wound/dressing, or calf pain - CONTACT YOUR SURGEON.                                                   FOLLOW-UP APPOINTMENTS:  If you do not already have a post-op appointment, please call the office for an appointment to be seen by your surgeon.  Guidelines for how soon to be seen are listed in your "After Visit Summary", but are typically between 1-4 weeks after surgery.  OTHER INSTRUCTIONS:   Knee Replacement:  Do not place pillow under knee, focus on keeping the knee straight while resting. CPM instructions: 0-90 degrees, 2 hours in the morning, 2 hours in the afternoon, and 2 hours in the evening. Place foam block, curve side up under heel at all times except when in CPM or when walking.  DO NOT modify, tear, cut, or change the foam  block in any way.   MAKE SURE YOU:  Understand these instructions.  Get help right away if you are not doing well or get worse.    Thank you for letting us be a part of your medical care team.  It is a privilege we respect greatly.  We hope these instructions will help you stay on track for a fast and full recovery!   Diagnostic Studies: DG Knee Right Port  Result Date: 08/06/2022 CLINICAL DATA:  [id number] S/P total knee arthroplasty, right [id number] EXAM: PORTABLE RIGHT KNEE - 1-2 VIEW COMPARISON:  None Available. FINDINGS: Postsurgical changes from right total knee arthroplasty. Arthroplasty components are in their expected alignment. No periprosthetic fracture or evidence of other complication. Expected postoperative changes within the overlying soft tissues. IMPRESSION: Postsurgical changes from right total knee arthroplasty. Electronically Signed   By: Duanne Guess D.O.   On: 08/06/2022 15:38    Disposition: Discharge disposition: 01-Home or Self Care       Discharge Instructions     Call MD / Call 911   Complete by: As directed    If you  experience chest pain or shortness of breath, CALL 911 and be transported to the hospital emergency room.  If you develope a fever above 101 F, pus (white drainage) or increased drainage or redness at the wound, or calf pain, call your surgeon's office.   Change dressing   Complete by: As directed    Do not remove your dressing.   Constipation Prevention   Complete by: As directed    Drink plenty of fluids.  Prune juice may be helpful.  You may use a stool softener, such as Colace (over the counter) 100 mg twice a day.  Use MiraLax (over the counter) for constipation as needed.   Diet - low sodium heart healthy   Complete by: As directed    Discharge instructions   Complete by: As directed    Elevate toes above nose. Use cryotherapy as needed for pain and swelling.   Do not put a pillow under the knee. Place it under the heel.   Complete by: As directed    Driving restrictions   Complete by: As directed    No driving for 6 weeks   Increase activity slowly as tolerated   Complete by: As directed    Lifting restrictions   Complete by: As directed    No lifting for 6 weeks   Post-operative opioid taper instructions:   Complete by: As directed    POST-OPERATIVE OPIOID TAPER INSTRUCTIONS: It is important to wean off of your opioid medication as soon as possible. If you do not need pain medication after your surgery it is ok to stop day one. Opioids include: Codeine, Hydrocodone(Norco, Vicodin), Oxycodone(Percocet, oxycontin) and hydromorphone amongst others.  Long term and even short term use of opiods can cause: Increased pain response Dependence Constipation Depression Respiratory depression And more.  Withdrawal symptoms can include Flu like symptoms Nausea, vomiting And more Techniques to manage these symptoms Hydrate well Eat regular healthy meals Stay active Use relaxation techniques(deep breathing, meditating, yoga) Do Not substitute Alcohol to help with tapering If you  have been on opioids for less than two weeks and do not have pain than it is ok to stop all together.  Plan to wean off of opioids This plan should start within one week post op of your joint replacement. Maintain the same interval or time between taking each dose and first decrease the dose.  Cut the total daily intake of opioids by one tablet each day Next start to increase the time between doses. The last dose that should be eliminated is the evening dose.      TED hose   Complete by: As directed    Use stockings (TED hose) for 2 weeks on both leg(s).  You may remove them at night for sleeping.   Weight bearing as tolerated   Complete by: As directed         Follow-up Information     Clois Dupes, PA-C. Schedule an appointment as soon as possible for a visit in 2 week(s).   Specialty: Orthopedic Surgery Why: For suture removal, For wound re-check Contact information: [address], [address] [place] [address] [phone]                  Signed: Clois Dupes 08/07/2022, 3:37 PM

## 2022-08-07 NOTE — TOC Transition Note (Signed)
Transition of Care [facility]) - CM/SW Discharge Note   Patient Details  Name: [name] MRN: [id number] Date of Birth: [birth year]  [facility] CM/SW Contact:  Amada Jupiter, LCSW Phone Number: 08/07/2022, 9:37 AM   Clinical Narrative:     Met with pt and confirming he has needed DME at home.  OPPT already arranged with [facility].  No TOC needs.   Final next level of care: OP Rehab Barriers to Discharge: No Barriers Identified   Patient Goals and CMS Choice      Discharge Placement                         Discharge Plan and Services Additional resources added to the After Visit Summary for                  DME Arranged: N/A DME Agency: NA                  Social Determinants of Health (SDOH) Interventions SDOH Screenings   Food Insecurity: No Food Insecurity (08/06/2022)  Housing: Low Risk  (08/06/2022)  Transportation Needs: No Transportation Needs (08/06/2022)  Utilities: Not At Risk (08/06/2022)  Tobacco Use: Medium Risk (08/06/2022)     Readmission Risk Interventions     No data to display

## 2022-08-07 NOTE — Anesthesia Postprocedure Evaluation (Signed)
Anesthesia Post Note  Patient: [name]  Procedure(s) Performed: COMPUTER ASSISTED TOTAL KNEE ARTHROPLASTY (Right: Knee)     Patient location during evaluation: PACU Anesthesia Type: Regional, Spinal and MAC Level of consciousness: awake and alert Pain management: pain level controlled Vital Signs Assessment: post-procedure vital signs reviewed and stable Respiratory status: spontaneous breathing and respiratory function stable Cardiovascular status: blood pressure returned to baseline and stable Postop Assessment: spinal receding Anesthetic complications: no   No notable events documented.  Last Vitals:  Vitals:   08/07/22 0124 08/07/22 0501  BP: (!) 146/52 (!) 146/58  Pulse: 73 60  Resp: 17 17  Temp: 36.6 C 36.7 C  SpO2: 95% 99%    Last Pain:  Vitals:   08/07/22 0826  TempSrc:   PainSc: 4                  Iyad Deroo

## 2022-08-07 NOTE — Evaluation (Signed)
Physical Therapy Evaluation Patient Details Name: [name] MRN: [id number] DOB: 07-Jul-1945 Today's Date: 08/07/2022  History of Present Illness  77 yo male s/p RTKA on 08/06/22. PMH: DM, OA, obesity, HTN, hx noncompliance  Clinical Impression  Patient evaluated by Physical Therapy with no further acute PT needs identified. All education has been completed and the patient has no further questions.  Pt and family feel ready to d/c home. Pt amb, tol HEP  See below for any follow-up Physical Therapy or equipment needs. PT is signing off. Thank you for this referral.        Recommendations for follow up therapy are one component of a multi-disciplinary discharge planning process, led by the attending physician.  Recommendations may be updated based on patient status, additional functional criteria and insurance authorization.  Follow Up Recommendations       Assistance Recommended at Discharge    Patient can return home with the following  A little help with walking and/or transfers;A little help with bathing/dressing/bathroom;Assist for transportation;Help with stairs or ramp for entrance;Assistance with cooking/housework    Equipment Recommendations None recommended by PT  Recommendations for Other Services       Functional Status Assessment Patient has had a recent decline in their functional status and demonstrates the ability to make significant improvements in function in a reasonable and predictable amount of time.     Precautions / Restrictions Precautions Precautions: Fall;Knee Restrictions Weight Bearing Restrictions: No Other Position/Activity Restrictions: WBAT      Mobility  Bed Mobility Overal bed mobility: Needs Assistance Bed Mobility: Supine to Sit     Supine to sit: Min guard     General bed mobility comments: incr time and effort, use of rail. sleeps in lift chair    Transfers Overall transfer level: Needs assistance Equipment used: Rolling  Thuman (2 wheels) Transfers: Sit to/from Stand Sit to Stand: Min guard           General transfer comment: cues for hand placement    Ambulation/Gait Ambulation/Gait assistance: Min guard, Supervision Gait Distance (Feet): 65 Feet Assistive device: Rolling Gerdts (2 wheels) Gait Pattern/deviations: Step-to pattern, Decreased stance time - right       General Gait Details: cues for initial sequence  Stairs            Wheelchair Mobility    Modified Rankin (Stroke Patients Only)       Balance Overall balance assessment: Mild deficits observed, not formally tested                                           Pertinent Vitals/Pain Pain Assessment Pain Assessment: 0-10 Pain Score: 4  Pain Location: R knee Pain Descriptors / Indicators: Aching, Sore Pain Intervention(s): Limited activity within patient's tolerance, Monitored during session, Premedicated before session, Repositioned, Ice applied    Home Living Family/patient expects to be discharged to:: Private residence Living Arrangements: Spouse/significant other Available Help at Discharge: Family Type of Home: House Home Access: Ramped entrance       Home Layout: One level Home Equipment: Rollator (4 wheels);Rolling Meine (2 wheels);Cane - single point      Prior Function Prior Level of Function : Independent/Modified Independent             Mobility Comments: amb with rollator, uses lift chair       Hand Dominance  Extremity/Trunk Assessment   Upper Extremity Assessment Upper Extremity Assessment: Overall WFL for tasks assessed    Lower Extremity Assessment Lower Extremity Assessment: RLE deficits/detail RLE Deficits / Details: ankle WFL, knee and hip grossly 2+ to 3/5. AAROM ~ 10 to 65 degrees knee flexion       Communication   Communication: No difficulties  Cognition Arousal/Alertness: Awake/alert Behavior During Therapy: WFL for tasks  assessed/performed Overall Cognitive Status: Within Functional Limits for tasks assessed                                          General Comments      Exercises Total Joint Exercises Ankle Circles/Pumps: AROM, 10 reps, Both Quad Sets: Both, 10 reps, AROM Heel Slides: AAROM, Right, 10 reps Hip ABduction/ADduction: AROM, Right, 10 reps Straight Leg Raises: AROM, 10 reps, Right   Assessment/Plan    PT Assessment All further PT needs can be met in the next venue of care  PT Problem List         PT Treatment Interventions      PT Goals (Current goals can be found in the Care Plan section)  Acute Rehab PT Goals Patient Stated Goal: be able to go [name]'s and eat with friends PT Goal Formulation: All assessment and education complete, DC therapy Time For Goal Achievement: 08/14/22    Frequency       Co-evaluation               AM-PAC PT "6 Clicks" Mobility  Outcome Measure Help needed turning from your back to your side while in a flat bed without using bedrails?: A Little Help needed moving from lying on your back to sitting on the side of a flat bed without using bedrails?: A Little Help needed moving to and from a bed to a chair (including a wheelchair)?: A Little Help needed standing up from a chair using your arms (e.g., wheelchair or bedside chair)?: A Little Help needed to walk in hospital room?: A Little Help needed climbing 3-5 steps with a railing? : A Little 6 Click Score: 18    End of Session Equipment Utilized During Treatment: Gait belt Activity Tolerance: Patient tolerated treatment well Patient left: in chair;with call bell/phone within reach;with chair alarm set;with family/visitor present Nurse Communication: Mobility status PT Visit Diagnosis: Other abnormalities of gait and mobility (R26.89);Difficulty in walking, not elsewhere classified (R26.2)    Time: 1610-9604 PT Time Calculation (min) (ACUTE ONLY): 39 min   Charges:    PT Evaluation $PT Eval Low Complexity: 1 Low PT Treatments $Gait Training: 8-22 mins $Therapeutic Exercise: 8-22 mins        Delice Bison, PT  Acute Rehab Dept [facility]) [phone]  08/07/2022   [facility] 08/07/2022, 9:55 AM

## 2022-08-10 ENCOUNTER — Encounter (HOSPITAL_COMMUNITY): Payer: Self-pay | Admitting: Orthopedic Surgery

## 2024-02-09 ENCOUNTER — Emergency Department (HOSPITAL_COMMUNITY): Admit: 2024-02-09 | Discharge: 2024-02-09 | Disposition: A | Discharge status: Home / Self Care

## 2024-02-09 ENCOUNTER — Emergency Department (HOSPITAL_COMMUNITY)
Admission: EM | Admit: 2024-02-09 | Discharge: 2024-02-09 | Disposition: A | Discharge status: Home / Self Care | Attending: Emergency Medicine | Admitting: Emergency Medicine

## 2024-02-09 DIAGNOSIS — I10 Essential (primary) hypertension: Secondary | ICD-10-CM | POA: Insufficient documentation

## 2024-02-09 DIAGNOSIS — R7989 Other specified abnormal findings of blood chemistry: Secondary | ICD-10-CM | POA: Diagnosis not present

## 2024-02-09 DIAGNOSIS — Z79899 Other long term (current) drug therapy: Secondary | ICD-10-CM | POA: Diagnosis not present

## 2024-02-09 DIAGNOSIS — Z794 Long term (current) use of insulin: Secondary | ICD-10-CM | POA: Diagnosis not present

## 2024-02-09 DIAGNOSIS — L03116 Cellulitis of left lower limb: Secondary | ICD-10-CM | POA: Insufficient documentation

## 2024-02-09 DIAGNOSIS — R600 Localized edema: Secondary | ICD-10-CM | POA: Diagnosis not present

## 2024-02-09 DIAGNOSIS — M7989 Other specified soft tissue disorders: Secondary | ICD-10-CM | POA: Diagnosis present

## 2024-02-09 DIAGNOSIS — D72829 Elevated white blood cell count, unspecified: Secondary | ICD-10-CM | POA: Diagnosis not present

## 2024-02-09 DIAGNOSIS — L0390 Cellulitis, unspecified: Secondary | ICD-10-CM

## 2024-02-09 LAB — COMPREHENSIVE METABOLIC PANEL WITH GFR
ALT: 13 U/L (ref 0–44)
AST: 27 U/L (ref 15–41)
Albumin: 3.7 g/dL (ref 3.5–5.0)
Alkaline Phosphatase: 82 U/L (ref 38–126)
Anion gap: 10 (ref 5–15)
BUN: 24 mg/dL — ABNORMAL HIGH (ref 8–23)
CO2: 21 mmol/L — ABNORMAL LOW (ref 22–32)
Calcium: 8.9 mg/dL (ref 8.9–10.3)
Chloride: 104 mmol/L (ref 98–111)
Creatinine, Ser: 1.37 mg/dL — ABNORMAL HIGH (ref 0.61–1.24)
GFR, Estimated: 52 mL/min — ABNORMAL LOW (ref >60)
Glucose, Bld: 189 mg/dL — ABNORMAL HIGH (ref 70–99)
Potassium: 5.1 mmol/L (ref 3.5–5.1)
Sodium: 136 mmol/L (ref 135–145)
Total Bilirubin: 0.5 mg/dL (ref 0.0–1.2)
Total Protein: 6.8 g/dL (ref 6.5–8.1)

## 2024-02-09 LAB — CBC
HCT: 40.8 % (ref 39.0–52.0)
Hemoglobin: 12.8 g/dL — ABNORMAL LOW (ref 13.0–17.0)
MCH: 31.4 pg (ref 26.0–34.0)
MCHC: 31.4 g/dL (ref 30.0–36.0)
MCV: 100 fL (ref 80.0–100.0)
Platelets: 208 K/uL (ref 150–400)
RBC: 4.08 MIL/uL — ABNORMAL LOW (ref 4.22–5.81)
RDW: 13.8 % (ref 11.5–15.5)
WBC: 12.7 K/uL — ABNORMAL HIGH (ref 4.0–10.5)
nRBC: 0 % (ref 0.0–0.2)

## 2024-02-09 MED ORDER — FUROSEMIDE 20 MG PO TABS: 20.0000 mg | ORAL_TABLET | Freq: Every day | ORAL | 0 refills | Status: AC

## 2024-02-09 MED ORDER — DEXTROSE 5 % IV SOLN
1500.0000 mg | Freq: Once | INTRAVENOUS | Status: AC
  Administered 2024-02-09: 1500 mg via INTRAVENOUS
  Filled 2024-02-09: qty 75

## 2024-02-09 MED ORDER — FUROSEMIDE 10 MG/ML IJ SOLN
20.0000 mg | Freq: Once | INTRAMUSCULAR | Status: AC
  Administered 2024-02-09: 20 mg via INTRAVENOUS
  Filled 2024-02-09: qty 4

## 2024-02-09 NOTE — ED Provider Notes (Signed)
 [place] EMERGENCY DEPARTMENT AT [facility] Provider Note   CSN: [id number] Arrival date & time: 02/09/24  1243     Patient presents with: Wound Infection   [name] is a 78 y.o. male.   HPI   Patient has history of osteoarthritis hyperlipidemia hypertension, urinary retention, prostate disease.  Patient states he has been having some issues with leg swelling and a rash in his lower extremity.  Patient states symptoms have been ongoing for over a week.  He went to see his primary care doctor at the [facility] and was given a prescription for doxycycline .  He does not think it is getting any better and he went back today.  He was sent from the office there to the emergency room for stronger antibiotics.  Patient denies any fevers or chills.  No vomiting or diarrhea.  He does have some swelling and redness in his other leg but has mostly noticed weeping from the left leg  Prior to Admission medications   Medication Sig Start Date End Date Taking? Authorizing Provider  furosemide (LASIX) 20 MG tablet Take 1 tablet (20 mg total) by mouth daily. 02/09/24  Yes Randol Simmonds, MD  acetaminophen  (TYLENOL ) 500 MG tablet Take 2 tablets (1,000 mg total) by mouth every 8 (eight) hours as needed for mild pain, moderate pain, fever or headache. 08/07/22   Hill, Valery RAMAN, PA-C  Alogliptin  Benzoate 25 MG TABS Take 25 mg by mouth daily.    [provider]  amLODipine  (NORVASC ) 2.5 MG tablet Take 2.5 mg by mouth daily.    [provider]  chlorpheniramine (CHLOR-TRIMETON) 4 MG tablet Take 4 mg by mouth 2 (two) times daily as needed for allergies.    [provider]  cholecalciferol (VITAMIN D3) 25 MCG (1000 UNIT) tablet Take 1,000 Units by mouth daily.    [provider]  doxazosin  (CARDURA ) 4 MG tablet Take 2 mg by mouth in the morning.    [provider]  empagliflozin (JARDIANCE) 25 MG TABS tablet Take 25 mg by mouth daily.    [provider]   glipiZIDE  (GLUCOTROL ) 5 MG tablet Take 5 mg by mouth daily before breakfast.    [provider]  Glucosamine-Chondroit-Vit C-Mn (GLUCOSAMINE CHONDROITIN COMPLX) CAPS Take 1 capsule by mouth 2 (two) times daily.    [provider]  insulin  glargine (LANTUS ) 100 UNIT/ML injection Inject 36 Units into the skin at bedtime.    [provider]  ipratropium (ATROVENT ) 0.03 % nasal spray Place 2 sprays into both nostrils daily as needed for rhinitis.    [provider]  levothyroxine  (SYNTHROID ) 137 MCG tablet Take 137 mcg by mouth daily before breakfast.    [provider]  losartan (COZAAR) 100 MG tablet Take 100 mg by mouth at bedtime.    [provider]  metFORMIN  (GLUMETZA ) 500 MG (MOD) 24 hr tablet Take 500 mg by mouth 2 (two) times daily with a meal.    [provider]  Multiple Vitamins-Minerals (MULTIVITAMIN WITH MINERALS) tablet Take 1 tablet by mouth daily.    [provider]  oxyCODONE  (ROXICODONE ) 5 MG immediate release tablet Take 1 tablet (5 mg total) by mouth every 4 (four) hours as needed for severe pain. 08/07/22   Hill, Valery RAMAN, PA-C  primidone  (MYSOLINE ) 50 MG tablet Take 50 mg by mouth 2 (two) times daily.    [provider]  propranolol  (INDERAL ) 20 MG tablet Take 20 mg by mouth daily.  [provider]  simvastatin  (ZOCOR ) 80 MG tablet Take 80 mg by mouth at bedtime.    [provider]  tamsulosin  (FLOMAX ) 0.4 MG CAPS capsule Take 0.4 mg by mouth daily.    [provider]    Allergies: Sulfa antibiotics, Morphine, Morphine and codeine, and Morphine sulfate    Review of Systems  Updated Vital Signs BP (!) 163/79   Pulse 80   Temp 97.8 F (36.6 C) (Oral)   Resp 18   Ht 1.727 m (5' 8)   Wt 108 kg   SpO2 99%   BMI 36.19 kg/m   Physical Exam Vitals and nursing note reviewed.  Constitutional:      Appearance: He is well-developed. He is not diaphoretic.  HENT:      Head: Normocephalic and atraumatic.     Right Ear: External ear normal.     Left Ear: External ear normal.  Eyes:     General: No scleral icterus.       Right eye: No discharge.        Left eye: No discharge.     Conjunctiva/sclera: Conjunctivae normal.  Neck:     Trachea: No tracheal deviation.  Cardiovascular:     Rate and Rhythm: Normal rate and regular rhythm.  Pulmonary:     Effort: Pulmonary effort is normal. No respiratory distress.     Breath sounds: Normal breath sounds. No stridor. No wheezing or rales.  Abdominal:     General: Bowel sounds are normal. There is no distension.     Palpations: Abdomen is soft.     Tenderness: There is no abdominal tenderness. There is no guarding or rebound.  Musculoskeletal:        General: No tenderness or deformity.     Cervical back: Neck supple.     Right lower leg: Edema present.     Left lower leg: Edema present.     Comments: Edema noted bilateral lower extremities, erythema of the bilateral lower extremities below the knee consistent with possible cellulitis versus venous stasis dermatitis.  There is a similar appearance bilateral lower extremities although slightly worse on the left leg, serous type drainage noted from the left leg  Skin:    General: Skin is warm and dry.     Findings: No rash.  Neurological:     General: No focal deficit present.     Mental Status: He is alert.     Cranial Nerves: No cranial nerve deficit, dysarthria or facial asymmetry.     Sensory: No sensory deficit.     Motor: No abnormal muscle tone or seizure activity.     Coordination: Coordination normal.  Psychiatric:        Mood and Affect: Mood normal.     (all labs ordered are listed, but only abnormal results are displayed) Labs Reviewed  COMPREHENSIVE METABOLIC PANEL WITH GFR - Abnormal; Notable for the following components:      Result Value   CO2 21 (*)    Glucose, Bld 189 (*)    BUN 24 (*)    Creatinine, Ser 1.37 (*)    GFR,  Estimated 52 (*)    All other components within normal limits  CBC - Abnormal; Notable for the following components:   WBC 12.7 (*)    RBC 4.08 (*)    Hemoglobin 12.8 (*)    All other components within normal limits    EKG: None  Radiology: VAS US  LOWER EXTREMITY VENOUS (DVT) (7a-7p) Result  Date: 02/09/2024  Lower Venous DVT Study Patient Name:  CARTHEL CASTILLE  Date of Exam:   02/09/2024 Medical Rec #: [id number]         Accession #:    [id number] Date of Birth: 18-Nov-1945         Patient Gender: M Patient Age:   60 years Exam Location:  [facility] Procedure:      VAS US  LOWER EXTREMITY VENOUS (DVT) Referring Phys: HAMP FONDAW --------------------------------------------------------------------------------  Indications: Swelling.  Risk Factors: None identified. Limitations: Body habitus and poor ultrasound/tissue interface. Comparison Study: No prior studies. Performing Technologist: Cordella Collet RVT  Examination Guidelines: A complete evaluation includes B-mode imaging, spectral Doppler, color Doppler, and power Doppler as needed of all accessible portions of each vessel. Bilateral testing is considered an integral part of a complete examination. Limited examinations for reoccurring indications may be performed as noted. The reflux portion of the exam is performed with the patient in reverse Trendelenburg.  +---------+---------------+---------+-----------+----------+-------------------+ RIGHT    CompressibilityPhasicitySpontaneityPropertiesThrombus Aging      +---------+---------------+---------+-----------+----------+-------------------+ CFV      Full           Yes      Yes                                      +---------+---------------+---------+-----------+----------+-------------------+ SFJ      Full                                                             +---------+---------------+---------+-----------+----------+-------------------+ FV Prox  Full                                                              +---------+---------------+---------+-----------+----------+-------------------+ FV Mid   Full                                                             +---------+---------------+---------+-----------+----------+-------------------+ FV DistalFull                                                             +---------+---------------+---------+-----------+----------+-------------------+ PFV      Full                                                             +---------+---------------+---------+-----------+----------+-------------------+ POP      Full           Yes      Yes                                      +---------+---------------+---------+-----------+----------+-------------------+  PTV      Full                                                             +---------+---------------+---------+-----------+----------+-------------------+ PERO                                                  Not well visualized +---------+---------------+---------+-----------+----------+-------------------+   +---------+---------------+---------+-----------+----------+--------------+ LEFT     CompressibilityPhasicitySpontaneityPropertiesThrombus Aging +---------+---------------+---------+-----------+----------+--------------+ CFV      Full           Yes      Yes                                 +---------+---------------+---------+-----------+----------+--------------+ SFJ      Full                                                        +---------+---------------+---------+-----------+----------+--------------+ FV Prox  Full                                                        +---------+---------------+---------+-----------+----------+--------------+ FV Mid   Full                                                        +---------+---------------+---------+-----------+----------+--------------+  FV DistalFull                                                        +---------+---------------+---------+-----------+----------+--------------+ PFV      Full                                                        +---------+---------------+---------+-----------+----------+--------------+ POP      Full           Yes      Yes                                 +---------+---------------+---------+-----------+----------+--------------+ PTV      Full                                                        +---------+---------------+---------+-----------+----------+--------------+  PERO     Full                                                        +---------+---------------+---------+-----------+----------+--------------+     Summary: RIGHT: - There is no evidence of deep vein thrombosis in the lower extremity. However, portions of this examination were limited- see technologist comments above.  - No cystic structure found in the popliteal fossa.  LEFT: - There is no evidence of deep vein thrombosis in the lower extremity. However, portions of this examination were limited- see technologist comments above.  - No cystic structure found in the popliteal fossa.  *See table(s) above for measurements and observations. Electronically signed by Lonni Gaskins MD on 02/09/2024 at 3:29:10 PM.    Final      Procedures   Medications Ordered in the ED  furosemide (LASIX) injection 20 mg (20 mg Intravenous Given 02/09/24 1841)  dalbavancin (DALVANCE) 1,500 mg in dextrose  5 % 500 mL IVPB (0 mg Intravenous Stopped 02/09/24 1920)    Clinical Course as of 02/09/24 2042  Wed Feb 09, 2024  1639 CBC(!) Slight leukocytosis with white count of 12.7 [JK]  1639 Comprehensive metabolic panel(!) Panel shows increased BUN and creatinine similar to previous [JK]  1639 Doppler study negative [JK]    Clinical Course User Index [JK] Randol Simmonds, MD                                 Medical  Decision Making Problems Addressed: Cellulitis of left lower extremity: chronic illness or injury with exacerbation, progression, or side effects of treatment Peripheral edema: acute illness or injury that poses a threat to life or bodily functions  Amount and/or Complexity of Data Reviewed Labs: ordered. Decision-making details documented in ED Course.  Risk Prescription drug management.   Patient presented to the ED for evaluation of persistent leg swelling.  Patient has also noticed a rash on his left leg.  He has been getting treatment for cellulitis.  On exam his findings are suggestive of possible venous stasis dermatitis.  There could also be a component of cellulitis.  The drainage that he has is mostly serous.  He does have bilateral peripheral edema and does have a rash on bilateral lower extremities.  I suspect a significant component of his swelling is related to peripheral edema and not necessarily cellulitis.  Patient does not have any evidence of DVT.  He does not show any signs of systemic infection.  He was given a dose of Dalvance.  I do not think he requires inpatient treatment.  Will discharge home on a course of diuretics and discussed compression dressings to help with the swelling.     Final diagnoses:  Peripheral edema  Cellulitis of left lower extremity    ED Discharge Orders          Ordered    Ambulatory referral to Infectious Disease       Comments: Cellulitis patient:  Received dalbavancin on 02/09/2024.   02/09/24 1710    furosemide (LASIX) 20 MG tablet  Daily        02/09/24 2042               Randol Simmonds, MD 02/09/24 2043

## 2024-02-09 NOTE — Progress Notes (Signed)
 Pharmacy Note: dalbavancin (DALVANCE ) for Acute Bacterial Skin and Skin Structure Infection (ABSSSI) Patients to [facility] Discharge   [name] is a 78 y.o. male who presented to [facility] on 02/09/2024 with an ABSSSI of left LE.  Inclusion criteria:  Indication: Cellulitis  Patient has at least one SIRS criteria present: WBC > 12,000 or < 4,000   Exclusion criteria: Patient was evaluated and negative for hardware involvement, hypotension / shock, elevated lactate > 2 without other explanation, gram-negative infection risk factors (bites, water  exposure, infection after trauma, infection after skin graft, burns, severe immunocompromise), necrotizing fasciitis possible / confirmed, known or suspected osteomyelitis or septic arthritis, endocarditis, diabetic foot infection, ischemic ulcers, post-operative wound infection, perirectal infection, need for drainage in the OR, hand / facial infections, injection drug users with  fever, bacteremia, pregnancy or breastfeeding, allergy related to antibiotics like vancomycin, known liver disease ( T.Bili > 2x ULN or AST/ALT > 3x ULN).  [name] 02/09/2024, 4:42 PM Clinical Pharmacist

## 2024-02-09 NOTE — ED Triage Notes (Addendum)
 Pt sent from the [facility] for infection getting worse in left leg. Pt has been taking Doxycycline  with no improvement

## 2024-02-09 NOTE — Discharge Instructions (Signed)
 The antibiotic you are given today should stay in your system for the next week.  You do not need additional doses.  Start taking the diuretic medication to help with the leg swelling.  You can also try doing leg wraps or compression stockings to help with the swelling.  Follow-up with your doctor to be rechecked

## 2024-02-09 NOTE — Progress Notes (Signed)
 Bilateral lower extremity venous duplex has been completed. Preliminary results can be found in CV Proc through chart review.  Results were given to [facility] PA.  02/09/24 1:26 PM Cathlyn Collet RVT

## 2024-02-09 NOTE — ED Provider Triage Note (Cosign Needed Addendum)
 Emergency Medicine Provider Triage Evaluation Note  SABIN GIBEAULT , a 78 y.o. male  was evaluated in triage.  Pt complains of 1.5 weeks of L leg pain. No fevers. No hx of DVT/PE, no hx of cancer.  Patient has 1 tablet left of doxycycline  which she has been taking outpatient.  Review of Systems  Positive: L leg pain Negative: Fever   Physical Exam  There were no vitals taken for this visit. Gen:   Awake, no distress   Resp:  Normal effort  MSK:   Moves extremities without difficulty  Other:  BL leg swelling and mild pitting edema. Perfused feet BL. Sensation nml.   Medical Decision Making  Medically screening exam initiated at 12:59 PM.  Appropriate orders placed.  Carlin LELON Finder was informed that the remainder of the evaluation will be completed by another provider, this initial triage assessment does not replace that evaluation, and the importance of remaining in the ED until their evaluation is complete.  DVT studies and labs    Neldon Hamp RAMAN, [place] 02/09/24 1302    Neldon Hamp [place], [place] 02/09/24 1320

## 2024-03-28 ENCOUNTER — Ambulatory Visit: Admission: RE | Admit: 2024-03-28 | Discharge: 2024-03-28 | Disposition: A | Source: Ambulatory Visit

## 2024-03-28 ENCOUNTER — Other Ambulatory Visit (HOSPITAL_COMMUNITY): Payer: Self-pay

## 2024-03-28 ENCOUNTER — Other Ambulatory Visit: Payer: Self-pay

## 2024-03-28 DIAGNOSIS — R140 Abdominal distension (gaseous): Secondary | ICD-10-CM
